# Patient Record
Sex: Female | Born: 1950 | Race: White | Hispanic: No | Smoking: Current every day smoker
Health system: Southern US, Community
[De-identification: ages and names within clinical notes are randomized; demographics above are authoritative.]

## PROBLEM LIST (undated history)

## (undated) DIAGNOSIS — E0590 Thyrotoxicosis, unspecified without thyrotoxic crisis or storm: Secondary | ICD-10-CM

## (undated) DIAGNOSIS — N816 Rectocele: Secondary | ICD-10-CM

## (undated) DIAGNOSIS — IMO0002 Reserved for concepts with insufficient information to code with codable children: Secondary | ICD-10-CM

## (undated) DIAGNOSIS — E785 Hyperlipidemia, unspecified: Secondary | ICD-10-CM

## (undated) DIAGNOSIS — M1990 Unspecified osteoarthritis, unspecified site: Secondary | ICD-10-CM

## (undated) DIAGNOSIS — K644 Residual hemorrhoidal skin tags: Secondary | ICD-10-CM

## (undated) DIAGNOSIS — K219 Gastro-esophageal reflux disease without esophagitis: Secondary | ICD-10-CM

## (undated) DIAGNOSIS — I10 Essential (primary) hypertension: Secondary | ICD-10-CM

## (undated) DIAGNOSIS — F419 Anxiety disorder, unspecified: Secondary | ICD-10-CM

## (undated) DIAGNOSIS — E119 Type 2 diabetes mellitus without complications: Secondary | ICD-10-CM

## (undated) HISTORY — DX: Residual hemorrhoidal skin tags: K64.4

## (undated) HISTORY — DX: Type 2 diabetes mellitus without complications: E11.9

## (undated) HISTORY — DX: Anxiety disorder, unspecified: F41.9

## (undated) HISTORY — DX: Thyrotoxicosis, unspecified without thyrotoxic crisis or storm: E05.90

## (undated) HISTORY — PX: BREAST BIOPSY: SHX20

## (undated) HISTORY — DX: Reserved for concepts with insufficient information to code with codable children: IMO0002

## (undated) HISTORY — DX: Gastro-esophageal reflux disease without esophagitis: K21.9

## (undated) HISTORY — DX: Hyperlipidemia, unspecified: E78.5

## (undated) HISTORY — DX: Rectocele: N81.6

## (undated) HISTORY — DX: Essential (primary) hypertension: I10

## (undated) HISTORY — PX: OTHER SURGICAL HISTORY: SHX169

---

## 2013-12-29 ENCOUNTER — Telehealth: Payer: Self-pay

## 2013-12-29 NOTE — Telephone Encounter (Signed)
PT was referred by Dr. Wende Neighbors for colonoscopy. Info said she had a previous bleed. Also abnormal labs ( LFT's and Anemia). The labs sent look good now. I have called and LMOM for a return call to get more info.

## 2013-12-30 NOTE — Telephone Encounter (Signed)
Pt called to be triaged. We clarified the abnormal labs was not checked on the referral. It was kind of confusing but her labs look great. She will call back tomorrow with list of meds. States she will be gone several weeks in Jan.

## 2014-02-05 NOTE — Telephone Encounter (Signed)
Letter mailed to pt to call when back from travels to schedule colonoscopy.

## 2014-08-09 ENCOUNTER — Other Ambulatory Visit (HOSPITAL_COMMUNITY): Payer: Self-pay | Admitting: Internal Medicine

## 2014-08-09 DIAGNOSIS — Z1231 Encounter for screening mammogram for malignant neoplasm of breast: Secondary | ICD-10-CM

## 2014-08-16 ENCOUNTER — Ambulatory Visit (HOSPITAL_COMMUNITY)
Admission: RE | Admit: 2014-08-16 | Discharge: 2014-08-16 | Disposition: A | Discharge status: Home / Self Care | Payer: 59 | Source: Ambulatory Visit | Attending: Internal Medicine | Admitting: Internal Medicine

## 2014-08-16 DIAGNOSIS — Z1231 Encounter for screening mammogram for malignant neoplasm of breast: Secondary | ICD-10-CM

## 2015-01-04 DIAGNOSIS — E039 Hypothyroidism, unspecified: Secondary | ICD-10-CM | POA: Diagnosis not present

## 2015-01-04 DIAGNOSIS — I10 Essential (primary) hypertension: Secondary | ICD-10-CM | POA: Diagnosis not present

## 2015-01-04 DIAGNOSIS — E782 Mixed hyperlipidemia: Secondary | ICD-10-CM | POA: Diagnosis not present

## 2015-01-04 DIAGNOSIS — E119 Type 2 diabetes mellitus without complications: Secondary | ICD-10-CM | POA: Diagnosis not present

## 2015-01-12 DIAGNOSIS — E782 Mixed hyperlipidemia: Secondary | ICD-10-CM | POA: Diagnosis not present

## 2015-01-12 DIAGNOSIS — I10 Essential (primary) hypertension: Secondary | ICD-10-CM | POA: Diagnosis not present

## 2015-01-12 DIAGNOSIS — R946 Abnormal results of thyroid function studies: Secondary | ICD-10-CM | POA: Diagnosis not present

## 2015-01-12 DIAGNOSIS — E119 Type 2 diabetes mellitus without complications: Secondary | ICD-10-CM | POA: Diagnosis not present

## 2015-02-07 DIAGNOSIS — J019 Acute sinusitis, unspecified: Secondary | ICD-10-CM | POA: Diagnosis not present

## 2015-04-11 DIAGNOSIS — E119 Type 2 diabetes mellitus without complications: Secondary | ICD-10-CM | POA: Diagnosis not present

## 2015-04-13 DIAGNOSIS — E119 Type 2 diabetes mellitus without complications: Secondary | ICD-10-CM | POA: Diagnosis not present

## 2015-04-13 DIAGNOSIS — E782 Mixed hyperlipidemia: Secondary | ICD-10-CM | POA: Diagnosis not present

## 2015-04-13 DIAGNOSIS — R946 Abnormal results of thyroid function studies: Secondary | ICD-10-CM | POA: Diagnosis not present

## 2015-04-13 DIAGNOSIS — I10 Essential (primary) hypertension: Secondary | ICD-10-CM | POA: Diagnosis not present

## 2015-04-26 ENCOUNTER — Encounter: Payer: Self-pay | Admitting: *Deleted

## 2015-05-05 DIAGNOSIS — L308 Other specified dermatitis: Secondary | ICD-10-CM | POA: Diagnosis not present

## 2015-05-05 DIAGNOSIS — D225 Melanocytic nevi of trunk: Secondary | ICD-10-CM | POA: Diagnosis not present

## 2015-05-05 DIAGNOSIS — L309 Dermatitis, unspecified: Secondary | ICD-10-CM | POA: Diagnosis not present

## 2015-05-05 DIAGNOSIS — L820 Inflamed seborrheic keratosis: Secondary | ICD-10-CM | POA: Diagnosis not present

## 2015-05-06 ENCOUNTER — Encounter: Payer: Self-pay | Admitting: Adult Health

## 2015-05-06 ENCOUNTER — Other Ambulatory Visit (HOSPITAL_COMMUNITY)
Admission: RE | Admit: 2015-05-06 | Discharge: 2015-05-06 | Disposition: A | Discharge status: Home / Self Care | Payer: PPO | Source: Ambulatory Visit | Attending: Adult Health | Admitting: Adult Health

## 2015-05-06 ENCOUNTER — Ambulatory Visit (INDEPENDENT_AMBULATORY_CARE_PROVIDER_SITE_OTHER): Payer: PPO | Admitting: Adult Health

## 2015-05-06 VITALS — BP 118/72 | HR 74 | Ht 66.25 in | Wt 204.0 lb

## 2015-05-06 DIAGNOSIS — Z1151 Encounter for screening for human papillomavirus (HPV): Secondary | ICD-10-CM | POA: Diagnosis not present

## 2015-05-06 DIAGNOSIS — Z1212 Encounter for screening for malignant neoplasm of rectum: Secondary | ICD-10-CM | POA: Diagnosis not present

## 2015-05-06 DIAGNOSIS — N816 Rectocele: Secondary | ICD-10-CM

## 2015-05-06 DIAGNOSIS — Z124 Encounter for screening for malignant neoplasm of cervix: Secondary | ICD-10-CM | POA: Diagnosis not present

## 2015-05-06 DIAGNOSIS — Z01419 Encounter for gynecological examination (general) (routine) without abnormal findings: Secondary | ICD-10-CM | POA: Diagnosis not present

## 2015-05-06 DIAGNOSIS — IMO0002 Reserved for concepts with insufficient information to code with codable children: Secondary | ICD-10-CM

## 2015-05-06 HISTORY — DX: Reserved for concepts with insufficient information to code with codable children: IMO0002

## 2015-05-06 HISTORY — DX: Rectocele: N81.6

## 2015-05-06 LAB — HEMOCCULT GUIAC POC 1CARD (OFFICE): FECAL OCCULT BLD: NEGATIVE

## 2015-05-06 MED ORDER — IBUPROFEN 800 MG PO TABS: 800.0000 mg | ORAL_TABLET | Freq: Three times a day (TID) | ORAL | Status: DC | PRN

## 2015-05-06 NOTE — Patient Instructions (Signed)
Physical in 1 year Pap in 3 if normal Mammogram yearly Colonoscopy advised Labs with PCP

## 2015-05-06 NOTE — Progress Notes (Signed)
Patient ID: Brandi Burke, female   DOB: 1950-05-01, 65 y.o.   MRN: [id number] History of Present Illness: Brandi Burke is a 65 year old white female in for a well woman gyn exam and pap. PCP is Dr Nevada Crane.   Current Medications, Allergies, Past Medical History, Past Surgical History, Family History and Social History were reviewed in [employer] record.     Review of Systems: Patient denies any headaches, hearing loss, fatigue, blurred vision, shortness of breath, chest pain, abdominal pain, problems with bowel movements, urination, or intercourse. No  mood swings.Has pain and aches in feet, hands and back at times. She saw Dr Allyn Kenner and had some AKs and moles froze and 1 biopsy on neck this week, has eye appt Monday and labs with Dr Nevada Crane next month.    Physical Exam:BP 118/72 mmHg  Pulse 74  Ht 5' 6.25" (1.683 m)  Wt 204 lb (92.534 kg)  BMI 32.67 kg/m2 General:  Well developed, well nourished, no acute distress Skin:  Warm and dry, has Aks and moles Neck:  Midline trachea, normal thyroid, good ROM, no lymphadenopathy, no carotid bruits heard Lungs; Clear to auscultation bilaterally Breast:  No dominant palpable mass, retraction, or nipple discharge Cardiovascular: Regular rate and rhythm Abdomen:  Soft, non tender, no hepatosplenomegaly,has navel ring Pelvic:  External genitalia is normal in appearance, no lesions.  The vagina has decreased color, moisture and rugae. Urethra has no lesions or masses. The cervix is smooth, pap with HPV performed, has mild cystocele.  Uterus is felt to be normal size, shape, and contour.  No adnexal masses or tenderness noted.Bladder is non tender, no masses felt. Rectal: Good sphincter tone, no polyps, or hemorrhoids felt.  Hemoccult negative.+rectocele Extremities/musculoskeletal:  No swelling or varicosities noted, no clubbing or cyanosis Psych:  No mood changes, alert and cooperative,seems happy   Impression: Well woman gyn exam  and pap Cystocele Rectocele     Plan: Physical in 1 year Pap in 3 if normal Labs with PCP Colonoscopy advised Mammogram yearly rx motrin 800 mg #60 take 1 every 8 hours prn with 1 refill

## 2015-05-11 LAB — CYTOLOGY - PAP

## 2015-05-25 ENCOUNTER — Telehealth: Payer: Self-pay | Admitting: Adult Health

## 2015-05-25 NOTE — Telephone Encounter (Signed)
Spoke with pt letting her know her pap was normal. Pt voiced understanding. JSY 

## 2015-07-25 DIAGNOSIS — E119 Type 2 diabetes mellitus without complications: Secondary | ICD-10-CM | POA: Diagnosis not present

## 2015-07-25 DIAGNOSIS — E039 Hypothyroidism, unspecified: Secondary | ICD-10-CM | POA: Diagnosis not present

## 2015-07-25 DIAGNOSIS — E782 Mixed hyperlipidemia: Secondary | ICD-10-CM | POA: Diagnosis not present

## 2015-07-27 ENCOUNTER — Other Ambulatory Visit (HOSPITAL_COMMUNITY): Payer: Self-pay | Admitting: Internal Medicine

## 2015-07-27 DIAGNOSIS — I10 Essential (primary) hypertension: Secondary | ICD-10-CM | POA: Diagnosis not present

## 2015-07-27 DIAGNOSIS — R946 Abnormal results of thyroid function studies: Secondary | ICD-10-CM | POA: Diagnosis not present

## 2015-07-27 DIAGNOSIS — Z1231 Encounter for screening mammogram for malignant neoplasm of breast: Secondary | ICD-10-CM

## 2015-07-27 DIAGNOSIS — R12 Heartburn: Secondary | ICD-10-CM | POA: Diagnosis not present

## 2015-07-27 DIAGNOSIS — E782 Mixed hyperlipidemia: Secondary | ICD-10-CM | POA: Diagnosis not present

## 2015-07-27 DIAGNOSIS — E119 Type 2 diabetes mellitus without complications: Secondary | ICD-10-CM | POA: Diagnosis not present

## 2015-07-27 DIAGNOSIS — F419 Anxiety disorder, unspecified: Secondary | ICD-10-CM | POA: Diagnosis not present

## 2015-07-28 DIAGNOSIS — E0590 Thyrotoxicosis, unspecified without thyrotoxic crisis or storm: Secondary | ICD-10-CM | POA: Diagnosis not present

## 2015-08-22 ENCOUNTER — Ambulatory Visit (HOSPITAL_COMMUNITY)
Admission: RE | Admit: 2015-08-22 | Discharge: 2015-08-22 | Disposition: A | Discharge status: Home / Self Care | Payer: PPO | Source: Ambulatory Visit | Attending: Internal Medicine | Admitting: Internal Medicine

## 2015-08-22 DIAGNOSIS — Z1231 Encounter for screening mammogram for malignant neoplasm of breast: Secondary | ICD-10-CM | POA: Diagnosis not present

## 2015-10-12 ENCOUNTER — Other Ambulatory Visit: Payer: Self-pay | Admitting: Adult Health

## 2015-11-02 DIAGNOSIS — E119 Type 2 diabetes mellitus without complications: Secondary | ICD-10-CM | POA: Diagnosis not present

## 2015-11-02 DIAGNOSIS — E782 Mixed hyperlipidemia: Secondary | ICD-10-CM | POA: Diagnosis not present

## 2015-11-04 DIAGNOSIS — R946 Abnormal results of thyroid function studies: Secondary | ICD-10-CM | POA: Diagnosis not present

## 2015-11-04 DIAGNOSIS — Z6831 Body mass index (BMI) 31.0-31.9, adult: Secondary | ICD-10-CM | POA: Diagnosis not present

## 2015-11-04 DIAGNOSIS — E782 Mixed hyperlipidemia: Secondary | ICD-10-CM | POA: Diagnosis not present

## 2015-11-04 DIAGNOSIS — I10 Essential (primary) hypertension: Secondary | ICD-10-CM | POA: Diagnosis not present

## 2015-11-04 DIAGNOSIS — E119 Type 2 diabetes mellitus without complications: Secondary | ICD-10-CM | POA: Diagnosis not present

## 2015-11-04 DIAGNOSIS — F419 Anxiety disorder, unspecified: Secondary | ICD-10-CM | POA: Diagnosis not present

## 2015-11-07 DIAGNOSIS — Z23 Encounter for immunization: Secondary | ICD-10-CM | POA: Diagnosis not present

## 2015-11-07 DIAGNOSIS — E0500 Thyrotoxicosis with diffuse goiter without thyrotoxic crisis or storm: Secondary | ICD-10-CM | POA: Diagnosis not present

## 2015-11-07 DIAGNOSIS — E119 Type 2 diabetes mellitus without complications: Secondary | ICD-10-CM | POA: Diagnosis not present

## 2015-12-09 DIAGNOSIS — M79642 Pain in left hand: Secondary | ICD-10-CM | POA: Diagnosis not present

## 2015-12-09 DIAGNOSIS — M79641 Pain in right hand: Secondary | ICD-10-CM | POA: Diagnosis not present

## 2015-12-14 DIAGNOSIS — G5601 Carpal tunnel syndrome, right upper limb: Secondary | ICD-10-CM | POA: Diagnosis not present

## 2015-12-14 DIAGNOSIS — G5602 Carpal tunnel syndrome, left upper limb: Secondary | ICD-10-CM | POA: Diagnosis not present

## 2015-12-23 DIAGNOSIS — M79642 Pain in left hand: Secondary | ICD-10-CM | POA: Diagnosis not present

## 2015-12-23 DIAGNOSIS — M79641 Pain in right hand: Secondary | ICD-10-CM | POA: Diagnosis not present

## 2015-12-29 ENCOUNTER — Other Ambulatory Visit: Payer: Self-pay | Admitting: Adult Health

## 2015-12-29 DIAGNOSIS — G5603 Carpal tunnel syndrome, bilateral upper limbs: Secondary | ICD-10-CM | POA: Diagnosis not present

## 2016-02-03 DIAGNOSIS — M79642 Pain in left hand: Secondary | ICD-10-CM | POA: Diagnosis not present

## 2016-02-03 DIAGNOSIS — M79641 Pain in right hand: Secondary | ICD-10-CM | POA: Diagnosis not present

## 2016-02-14 DIAGNOSIS — Z13820 Encounter for screening for osteoporosis: Secondary | ICD-10-CM | POA: Diagnosis not present

## 2016-02-14 DIAGNOSIS — I10 Essential (primary) hypertension: Secondary | ICD-10-CM | POA: Diagnosis not present

## 2016-02-14 DIAGNOSIS — E119 Type 2 diabetes mellitus without complications: Secondary | ICD-10-CM | POA: Diagnosis not present

## 2016-02-14 DIAGNOSIS — E0590 Thyrotoxicosis, unspecified without thyrotoxic crisis or storm: Secondary | ICD-10-CM | POA: Diagnosis not present

## 2016-02-23 DIAGNOSIS — G5600 Carpal tunnel syndrome, unspecified upper limb: Secondary | ICD-10-CM | POA: Diagnosis not present

## 2016-02-23 DIAGNOSIS — M1991 Primary osteoarthritis, unspecified site: Secondary | ICD-10-CM | POA: Diagnosis not present

## 2016-03-23 ENCOUNTER — Other Ambulatory Visit: Payer: Self-pay | Admitting: Adult Health

## 2016-05-02 DIAGNOSIS — I10 Essential (primary) hypertension: Secondary | ICD-10-CM | POA: Diagnosis not present

## 2016-05-02 DIAGNOSIS — E119 Type 2 diabetes mellitus without complications: Secondary | ICD-10-CM | POA: Diagnosis not present

## 2016-05-02 DIAGNOSIS — R946 Abnormal results of thyroid function studies: Secondary | ICD-10-CM | POA: Diagnosis not present

## 2016-05-04 DIAGNOSIS — R946 Abnormal results of thyroid function studies: Secondary | ICD-10-CM | POA: Diagnosis not present

## 2016-05-04 DIAGNOSIS — E782 Mixed hyperlipidemia: Secondary | ICD-10-CM | POA: Diagnosis not present

## 2016-05-04 DIAGNOSIS — E0590 Thyrotoxicosis, unspecified without thyrotoxic crisis or storm: Secondary | ICD-10-CM | POA: Diagnosis not present

## 2016-05-04 DIAGNOSIS — F419 Anxiety disorder, unspecified: Secondary | ICD-10-CM | POA: Diagnosis not present

## 2016-05-04 DIAGNOSIS — Z720 Tobacco use: Secondary | ICD-10-CM | POA: Diagnosis not present

## 2016-05-04 DIAGNOSIS — I10 Essential (primary) hypertension: Secondary | ICD-10-CM | POA: Diagnosis not present

## 2016-05-04 DIAGNOSIS — Z6831 Body mass index (BMI) 31.0-31.9, adult: Secondary | ICD-10-CM | POA: Diagnosis not present

## 2016-05-04 DIAGNOSIS — E119 Type 2 diabetes mellitus without complications: Secondary | ICD-10-CM | POA: Diagnosis not present

## 2016-05-04 DIAGNOSIS — F39 Unspecified mood [affective] disorder: Secondary | ICD-10-CM | POA: Diagnosis not present

## 2016-05-04 DIAGNOSIS — R12 Heartburn: Secondary | ICD-10-CM | POA: Diagnosis not present

## 2016-05-07 ENCOUNTER — Other Ambulatory Visit: Payer: PPO | Admitting: Adult Health

## 2016-05-16 ENCOUNTER — Encounter: Payer: Self-pay | Admitting: Adult Health

## 2016-05-16 ENCOUNTER — Ambulatory Visit (INDEPENDENT_AMBULATORY_CARE_PROVIDER_SITE_OTHER): Payer: PPO | Admitting: Adult Health

## 2016-05-16 VITALS — BP 160/90 | HR 74 | Ht 66.0 in | Wt 195.0 lb

## 2016-05-16 DIAGNOSIS — Z1211 Encounter for screening for malignant neoplasm of colon: Secondary | ICD-10-CM

## 2016-05-16 DIAGNOSIS — Z01419 Encounter for gynecological examination (general) (routine) without abnormal findings: Secondary | ICD-10-CM

## 2016-05-16 DIAGNOSIS — Z1212 Encounter for screening for malignant neoplasm of rectum: Secondary | ICD-10-CM | POA: Diagnosis not present

## 2016-05-16 DIAGNOSIS — N816 Rectocele: Secondary | ICD-10-CM

## 2016-05-16 LAB — HEMOCCULT GUIAC POC 1CARD (OFFICE): FECAL OCCULT BLD: NEGATIVE

## 2016-05-16 MED ORDER — IBUPROFEN 800 MG PO TABS: ORAL_TABLET | ORAL | 1 refills | Status: DC

## 2016-05-16 NOTE — Progress Notes (Addendum)
Patient ID: Brandi Burke, female   DOB: [birth year], 66 y.o.   MRN: [id number] History of Present Illness: Brandi Burke is a 66 year old white female in for well woman gyn exam,she had normal pap 05/06/15. PCP is Dr. Nevada Crane   Current Medications, Allergies, Past Medical History, Past Surgical History, Family History and Social History were reviewed in [facility] record.     Review of Systems: Patient denies any headaches, hearing loss, fatigue, blurred vision, shortness of breath, chest pain, abdominal pain, problems with bowel movements, urination, or intercourse. No joint pain or mood swings.    Physical Exam:BP (!) 160/90 (BP Location: Right Arm, Patient Position: Sitting, Cuff Size: Small)   Pulse 74   Ht 5\' 6"  (1.676 m)   Wt 195 lb (88.5 kg)   BMI 31.47 kg/m  General:  Well developed, well nourished, no acute distress Skin:  Warm and dry Neck:  Midline trachea, normal thyroid, good ROM, no lymphadenopathy.No carotid bruits  Lungs; Clear to auscultation bilaterally Breast:  No dominant palpable mass, retraction, or nipple discharge Cardiovascular: Regular rate and rhythm Abdomen:  Soft, non tender, no hepatosplenomegaly Pelvic:  External genitalia is normal in appearance, no lesions.  The vagina is normal in appearance. Urethra has no lesions or masses. The cervix is smooth.  Uterus is felt to be normal size, shape, and contour.  No adnexal masses or tenderness noted.Bladder is non tender, no masses felt. Rectal: Good sphincter tone, no polyps, or hemorrhoids felt.  Hemoccult negative.+rectocele Extremities/musculoskeletal:  No swelling or varicosities noted, no clubbing or cyanosis Psych:  No mood changes, alert and cooperative,seems happy   Impression: 1. Well woman exam with routine gynecological exam   2. Rectocele   3. Screening for colorectal cancer       Plan: Physical in 1 year Pap in 2020 Mammogram yearly Labs with PCP Colonoscopy per  GI Refilled ibuprofen 800 mg #60 with 1 refill

## 2016-06-01 DIAGNOSIS — G5603 Carpal tunnel syndrome, bilateral upper limbs: Secondary | ICD-10-CM | POA: Diagnosis not present

## 2016-06-01 DIAGNOSIS — F419 Anxiety disorder, unspecified: Secondary | ICD-10-CM | POA: Diagnosis not present

## 2016-06-01 DIAGNOSIS — I10 Essential (primary) hypertension: Secondary | ICD-10-CM | POA: Diagnosis not present

## 2016-06-01 DIAGNOSIS — F39 Unspecified mood [affective] disorder: Secondary | ICD-10-CM | POA: Diagnosis not present

## 2016-06-01 DIAGNOSIS — M25561 Pain in right knee: Secondary | ICD-10-CM | POA: Diagnosis not present

## 2016-06-05 ENCOUNTER — Telehealth: Payer: Self-pay

## 2016-06-05 NOTE — Telephone Encounter (Signed)
[phone]  Patient received letter to schedule tcs

## 2016-06-12 ENCOUNTER — Telehealth: Payer: Self-pay

## 2016-06-12 NOTE — Telephone Encounter (Signed)
LMOM for a return call.  

## 2016-06-12 NOTE — Telephone Encounter (Signed)
See separate triage.  

## 2016-06-13 ENCOUNTER — Other Ambulatory Visit: Payer: Self-pay

## 2016-06-13 DIAGNOSIS — Z1211 Encounter for screening for malignant neoplasm of colon: Secondary | ICD-10-CM

## 2016-06-13 NOTE — Telephone Encounter (Signed)
PT NEEDS TCS W/ MAC DUE TO POLYPHARMACY. NEED OPV PRIOR TO TCS.

## 2016-06-13 NOTE — Telephone Encounter (Signed)
Gastroenterology Pre-Procedure Review  Request Date: 06/12/2016 Requesting Physician: Dr. Wende Neighbors  PATIENT REVIEW QUESTIONS: The patient responded to the following health history questions as indicated:    1. Diabetes Melitis: YES 2. Joint replacements in the past 12 months: no 3. Major health problems in the past 3 months: no 4. Has an artificial valve or MVP: no 5. Has a defibrillator: no 6. Has been advised in past to take antibiotics in advance of a procedure like teeth cleaning: no 7. Family history of colon cancer: no  8. Alcohol Use: Rarely 9. History of sleep apnea: no  10. History of coronary artery or other vascular stents placed within the last 12 months: no    MEDICATIONS & ALLERGIES:    Patient reports the following regarding taking any blood thinners:   Plavix? no Aspirin? no Coumadin? no Brilinta? no Xarelto? no Eliquis? no Pradaxa? no Savaysa? no Effient? no  Patient confirms/reports the following medications:  Current Outpatient Prescriptions  Medication Sig Dispense Refill  . ALPRAZolam (XANAX) 1 MG tablet Take 1 mg by mouth at bedtime.    Marland Kitchen atorvastatin (LIPITOR) 20 MG tablet Take 10 mg by mouth daily.     . Calcium Carbonate-Vit D-Min (GNP CALCIUM 600 PLUS D/MINERAL PO) Take by mouth.    . DULoxetine (CYMBALTA) 20 MG capsule Take 40 mg by mouth daily.    Marland Kitchen ibuprofen (ADVIL,MOTRIN) 800 MG tablet TAKE 1 TABLET(800 MG) BY MOUTH EVERY 8 HOURS AS NEEDED 60 tablet 1  . methimazole (TAPAZOLE) 10 MG tablet 5 mg. Takes 1/2 of 5 mg daily    . Omega-3 Fatty Acids (FISH OIL) 1000 MG CAPS Take 500 mg by mouth.     Marland Kitchen omeprazole (PRILOSEC) 20 MG capsule Take 20 mg by mouth daily.    . sitaGLIPtin-metformin (JANUMET) 50-1000 MG tablet Take 1 tablet by mouth daily. Takes one tablet in the AM    . valsartan-hydrochlorothiazide (DIOVAN HCT) 160-25 MG tablet Take 1 tablet by mouth daily.     . methimazole (TAPAZOLE) 10 MG tablet      No current facility-administered  medications for this visit.     Patient confirms/reports the following allergies:  No Known Allergies  No orders of the defined types were placed in this encounter.   AUTHORIZATION INFORMATION Primary Insurance:   ID #:   Group #:  Pre-Cert / Auth required:  Pre-Cert / Auth #:   Secondary Insurance:   ID #:   Group #:  Pre-Cert / Auth required:  Pre-Cert / Auth #:   SCHEDULE INFORMATION: Procedure has been scheduled as follows:  Date: 07/25/2016         Time:  8:30 AM Location: [facility] Short Stay  This Gastroenterology Pre-Precedure Review Form is being routed to the following provider(s): Barney Drain, MD

## 2016-06-14 DIAGNOSIS — M1810 Unilateral primary osteoarthritis of first carpometacarpal joint, unspecified hand: Secondary | ICD-10-CM | POA: Diagnosis not present

## 2016-06-14 DIAGNOSIS — M25561 Pain in right knee: Secondary | ICD-10-CM | POA: Diagnosis not present

## 2016-06-14 NOTE — Telephone Encounter (Signed)
I called Hoyle Sauer and cancelled her tcs appt

## 2016-06-14 NOTE — Telephone Encounter (Signed)
Pt has an ov 08/08/16 at 3:00 pm and the patient is aware of her appt

## 2016-07-19 DIAGNOSIS — E0590 Thyrotoxicosis, unspecified without thyrotoxic crisis or storm: Secondary | ICD-10-CM | POA: Diagnosis not present

## 2016-07-19 DIAGNOSIS — E119 Type 2 diabetes mellitus without complications: Secondary | ICD-10-CM | POA: Diagnosis not present

## 2016-07-19 DIAGNOSIS — I10 Essential (primary) hypertension: Secondary | ICD-10-CM | POA: Diagnosis not present

## 2016-07-25 ENCOUNTER — Encounter (HOSPITAL_COMMUNITY): Payer: Self-pay

## 2016-07-25 ENCOUNTER — Ambulatory Visit (HOSPITAL_COMMUNITY): Admit: 2016-07-25 | Payer: PPO | Admitting: Gastroenterology

## 2016-07-25 SURGERY — COLONOSCOPY
Anesthesia: Moderate Sedation

## 2016-08-08 ENCOUNTER — Ambulatory Visit (INDEPENDENT_AMBULATORY_CARE_PROVIDER_SITE_OTHER): Payer: PPO | Admitting: Gastroenterology

## 2016-08-08 DIAGNOSIS — Z79899 Other long term (current) drug therapy: Secondary | ICD-10-CM

## 2016-08-08 DIAGNOSIS — Z1211 Encounter for screening for malignant neoplasm of colon: Secondary | ICD-10-CM | POA: Diagnosis not present

## 2016-08-09 ENCOUNTER — Other Ambulatory Visit: Payer: Self-pay

## 2016-08-09 ENCOUNTER — Encounter: Payer: Self-pay | Admitting: Gastroenterology

## 2016-08-09 DIAGNOSIS — Z79899 Other long term (current) drug therapy: Secondary | ICD-10-CM | POA: Insufficient documentation

## 2016-08-09 DIAGNOSIS — Z1211 Encounter for screening for malignant neoplasm of colon: Secondary | ICD-10-CM

## 2016-08-09 MED ORDER — PEG 3350-KCL-NA BICARB-NACL 420 G PO SOLR: 4000.0000 mL | ORAL | 0 refills | Status: DC

## 2016-08-09 NOTE — Patient Instructions (Signed)
1. Colonoscopy in the near future. Please see separate instructions. 

## 2016-08-09 NOTE — Progress Notes (Signed)
cc'ed to pcp °

## 2016-08-09 NOTE — Progress Notes (Signed)
Called pt. Colonoscopy w/Propofol with [facility] scheduled for 09/04/16 at 7:30am. She is aware of med adjustments for procedure. Rx for prep sent to pharmacy. Instructions mailed. Orders entered. I will call her back with pre-op appt.

## 2016-08-09 NOTE — Progress Notes (Signed)
Primary Care Physician:  Celene Squibb, MD  Primary Gastroenterologist:  Barney Drain, MD   Chief Complaint  Patient presents with  . Colonoscopy    HPI:  Brandi Burke is a 66 y.o. female here To schedule screening colonoscopy. Her last one was over 10 years ago was reportedly normal. No bowel concerns. Bowel movements are regular. No blood in the stool or melena. No abdominal pain. Denies upper GI symptoms. Dr. Oneida Alar recommended deep sedation in the OR.  Current Outpatient Prescriptions  Medication Sig Dispense Refill  . ALPRAZolam (XANAX) 1 MG tablet Take 1 mg by mouth at bedtime.    Marland Kitchen atorvastatin (LIPITOR) 20 MG tablet Take 10 mg by mouth daily.     . Calcium Carbonate-Vit D-Min (GNP CALCIUM 600 PLUS D/MINERAL PO) Take by mouth.    . DULoxetine (CYMBALTA) 20 MG capsule Take 40 mg by mouth daily.    Marland Kitchen ibuprofen (ADVIL,MOTRIN) 800 MG tablet TAKE 1 TABLET(800 MG) BY MOUTH EVERY 8 HOURS AS NEEDED 60 tablet 1  . JANUMET XR [phone] MG TB24 Take 1 tablet by mouth daily.  2  . losartan-hydrochlorothiazide (HYZAAR) 100-25 MG tablet Take 1 tablet by mouth daily.  0  . methimazole (TAPAZOLE) 10 MG tablet 5 mg. Takes 1/2 of 5 mg daily    . Omega-3 Fatty Acids (FISH OIL) 1000 MG CAPS Take 500 mg by mouth.     . methimazole (TAPAZOLE) 10 MG tablet      No current facility-administered medications for this visit.     Allergies as of 08/08/2016  . (No Known Allergies)    Past Medical History:  Diagnosis Date  . Anxiety   . Cystocele 05/06/2015  . Diabetes mellitus without complication (HCC)    Type 2  . GERD (gastroesophageal reflux disease)   . Hyperlipidemia   . Hypertension   . Rectocele 05/06/2015  . Thyrotoxicosis    without mention of goiter or other cause, and without mention of thyrotoxic crisis or storm    Past Surgical History:  Procedure Laterality Date  . bone spur     right small toe  . CESAREAN SECTION  [id number]  . moles      Family History  Problem Relation Age  of Onset  . Hypertension Mother   . Cancer Mother        breast  . Emphysema Father   . Other Sister        complications from MVA  . Diabetes Sister   . Cancer Sister        breast  . Cancer Sister        breast  . Cancer Sister        breast  . Diabetes Other        runs in dad's side of family  . Colon cancer Neg Hx     Social History   Social History  . Marital status: Legally Separated    Spouse name: N/A  . Number of children: N/A  . Years of education: N/A   Occupational History  . Not on file.   Social History Main Topics  . Smoking status: Current Every Day Smoker    Years: 40.00    Types: Cigarettes  . Smokeless tobacco: Never Used     Comment: quit smoking 01/02/11, started back 02/2016.  Marland Kitchen Alcohol use Yes     Comment: couple of beers on weekend  . Drug use: No  . Sexual activity: Yes    Birth control/  protection: Post-menopausal   Other Topics Concern  . Not on file   Social History Narrative  . No narrative on file      ROS:  General: Negative for anorexia, weight loss, fever, chills, fatigue, weakness. Eyes: Negative for vision changes.  ENT: Negative for hoarseness, difficulty swallowing , nasal congestion. CV: Negative for chest pain, angina, palpitations, dyspnea on exertion, peripheral edema.  Respiratory: Negative for dyspnea at rest, dyspnea on exertion, cough, sputum, wheezing.  GI: See history of present illness. GU:  Negative for dysuria, hematuria, urinary incontinence, urinary frequency, nocturnal urination.  MS: Negative for joint pain, low back pain.  Derm: Negative for rash or itching.  Neuro: Negative for weakness, abnormal sensation, seizure, frequent headaches, memory loss, confusion.  Psych: Negative for anxiety, depression, suicidal ideation, hallucinations.  Endo: Negative for unusual weight change.  Heme: Negative for bruising or bleeding. Allergy: Negative for rash or hives.    Physical Examination:  BP (!) 153/77    Pulse (!) 111   Temp 98.2 F (36.8 C) (Oral)   Ht 5\' 8"  (1.727 m)   Wt 191 lb (86.6 kg)   BMI 29.04 kg/m    General: Well-nourished, well-developed in no acute distress.  Head: Normocephalic, atraumatic.   Eyes: Conjunctiva pink, no icterus. Mouth: Oropharyngeal mucosa moist and pink , no lesions erythema or exudate. Neck: Supple without thyromegaly, masses, or lymphadenopathy.  Lungs: Clear to auscultation bilaterally.  Heart: Regular rate and rhythm, no murmurs rubs or gallops.  Abdomen: Bowel sounds are normal, nontender, nondistended, no hepatosplenomegaly or masses, no abdominal bruits or    hernia , no rebound or guarding.   Rectal: Deferred Extremities: No lower extremity edema. No clubbing or deformities.  Neuro: Alert and oriented x 4 , grossly normal neurologically.  Skin: Warm and dry, no rash or jaundice.   Psych: Alert and cooperative, normal mood and affect.  Labs: Heme negative 04/2016  Imaging Studies: No results found.

## 2016-08-09 NOTE — Progress Notes (Signed)
Please schedule TCS with [facility] in OR (polypharmacy).  Day before colonoscopy: Half dose Janumet Morning of colonoscopy: Hold Janumet, may take losartan-hydrochlorothiazide, methimazole.

## 2016-08-09 NOTE — Progress Notes (Signed)
Called and informed pt of pre-op appt 08/29/16 at 10:00am. Letter mailed with instructions.

## 2016-08-09 NOTE — Assessment & Plan Note (Signed)
Due for average risk screening colonoscopy. Given polypharmacy it is felt that she would require deep sedation in the OR.  I have discussed the risks, alternatives, benefits with regards to but not limited to the risk of reaction to medication, bleeding, infection, perforation and the patient is agreeable to proceed. Written consent to be obtained.

## 2016-08-10 NOTE — Patient Instructions (Signed)
PA info for TCS submitted via [employer]. Case suspended. Outpatient authorization# [id number].

## 2016-08-13 NOTE — Patient Instructions (Signed)
Received fax from [facility] PA# [id number] for TCS, 08/10/16-11/08/16

## 2016-08-24 NOTE — Patient Instructions (Signed)
Brandi Burke  08/24/2016     @PREFPERIOPPHARMACY @   Your procedure is scheduled on  09/04/2016.  Report to [facility] at  615  A.M.  Call this number if you have problems the morning of surgery:  [phone]   Remember:  Do not eat food or drink liquids after midnight.  Take these medicines the morning of surgery with A SIP OF WATER  Cymbalta, losartan, tapazole.   Do not wear jewelry, make-up or nail polish.  Do not wear lotions, powders, or perfumes, or deoderant.  Do not shave 48 hours prior to surgery.  Men may shave face and neck.  Do not bring valuables to the hospital.  [facility] is not responsible for any belongings or valuables.  Contacts, dentures or bridgework may not be worn into surgery.  Leave your suitcase in the car.  After surgery it may be brought to your room.  For patients admitted to the hospital, discharge time will be determined by your treatment team.  Patients discharged the day of surgery will not be allowed to drive home.   Name and phone number of your driver:   family Special instructions:  Follow the diet and prep instructions given to you by Dr Nona Dell office.  Please read over the following fact sheets that you were given. Anesthesia Post-op Instructions and Care and Recovery After Surgery       Colonoscopy, Adult A colonoscopy is an exam to look at the entire large intestine. During the exam, a lubricated, bendable tube is inserted into the anus and then passed into the rectum, colon, and other parts of the large intestine. A colonoscopy is often done as a part of normal colorectal screening or in response to certain symptoms, such as anemia, persistent diarrhea, abdominal pain, and blood in the stool. The exam can help screen for and diagnose medical problems, including:  Tumors.  Polyps.  Inflammation.  Areas of bleeding.  Tell a health care provider about:  Any allergies you have.  All medicines you are  taking, including vitamins, herbs, eye drops, creams, and over-the-counter medicines.  Any problems you or family members have had with anesthetic medicines.  Any blood disorders you have.  Any surgeries you have had.  Any medical conditions you have.  Any problems you have had passing stool. What are the risks? Generally, this is a safe procedure. However, problems may occur, including:  Bleeding.  A tear in the intestine.  A reaction to medicines given during the exam.  Infection (rare).  What happens before the procedure? Eating and drinking restrictions Follow instructions from your health care provider about eating and drinking, which may include:  A few days before the procedure - follow a low-fiber diet. Avoid nuts, seeds, dried fruit, raw fruits, and vegetables.  1-3 days before the procedure - follow a clear liquid diet. Drink only clear liquids, such as clear broth or bouillon, black coffee or tea, clear juice, clear soft drinks or sports drinks, gelatin dessert, and popsicles. Avoid any liquids that contain red or purple dye.  On the day of the procedure - do not eat or drink anything during the 2 hours before the procedure, or within the time period that your health care provider recommends.  Bowel prep If you were prescribed an oral bowel prep to clean out your colon:  Take it as told by your health care provider. Starting the day before your procedure, you will  need to drink a large amount of medicated liquid. The liquid will cause you to have multiple loose stools until your stool is almost clear or light green.  If your skin or anus gets irritated from diarrhea, you may use these to relieve the irritation: ? Medicated wipes, such as adult wet wipes with aloe and vitamin E. ? A skin soothing-product like petroleum jelly.  If you vomit while drinking the bowel prep, take a break for up to 60 minutes and then begin the bowel prep again. If vomiting continues and  you cannot take the bowel prep without vomiting, call your health care provider.  General instructions  Ask your health care provider about changing or stopping your regular medicines. This is especially important if you are taking diabetes medicines or blood thinners.  Plan to have someone take you home from the hospital or clinic. What happens during the procedure?  An IV tube may be inserted into one of your veins.  You will be given medicine to help you relax (sedative).  To reduce your risk of infection: ? Your health care team will wash or sanitize their hands. ? Your anal area will be washed with soap.  You will be asked to lie on your side with your knees bent.  Your health care provider will lubricate a long, thin, flexible tube. The tube will have a camera and a light on the end.  The tube will be inserted into your anus.  The tube will be gently eased through your rectum and colon.  Air will be delivered into your colon to keep it open. You may feel some pressure or cramping.  The camera will be used to take images during the procedure.  A small tissue sample may be removed from your body to be examined under a microscope (biopsy). If any potential problems are found, the tissue will be sent to a lab for testing.  If small polyps are found, your health care provider may remove them and have them checked for cancer cells.  The tube that was inserted into your anus will be slowly removed. The procedure may vary among health care providers and hospitals. What happens after the procedure?  Your blood pressure, heart rate, breathing rate, and blood oxygen level will be monitored until the medicines you were given have worn off.  Do not drive for 24 hours after the exam.  You may have a small amount of blood in your stool.  You may pass gas and have mild abdominal cramping or bloating due to the air that was used to inflate your colon during the exam.  It is up to  you to get the results of your procedure. Ask your health care provider, or the department performing the procedure, when your results will be ready. This information is not intended to replace advice given to you by your health care provider. Make sure you discuss any questions you have with your health care provider. Document Released: 12/16/1999 Document Revised: 10/19/2015 Document Reviewed: 03/01/2015 Elsevier Interactive Patient Education  2018 [employer].  Colonoscopy, Adult, Care After This sheet gives you information about how to care for yourself after your procedure. Your health care provider may also give you more specific instructions. If you have problems or questions, contact your health care provider. What can I expect after the procedure? After the procedure, it is common to have:  A small amount of blood in your stool for 24 hours after the procedure.  Some gas.  Mild abdominal cramping or bloating.  Follow these instructions at home: General instructions   For the first 24 hours after the procedure: ? Do not drive or use machinery. ? Do not sign important documents. ? Do not drink alcohol. ? Do your regular daily activities at a slower pace than normal. ? Eat soft, easy-to-digest foods. ? Rest often.  Take over-the-counter or prescription medicines only as told by your health care provider.  It is up to you to get the results of your procedure. Ask your health care provider, or the department performing the procedure, when your results will be ready. Relieving cramping and bloating  Try walking around when you have cramps or feel bloated.  Apply heat to your abdomen as told by your health care provider. Use a heat source that your health care provider recommends, such as a moist heat pack or a heating pad. ? Place a towel between your skin and the heat source. ? Leave the heat on for 20-30 minutes. ? Remove the heat if your skin turns bright red. This is  especially important if you are unable to feel pain, heat, or cold. You may have a greater risk of getting burned. Eating and drinking  Drink enough fluid to keep your urine clear or pale yellow.  Resume your normal diet as instructed by your health care provider. Avoid heavy or fried foods that are hard to digest.  Avoid drinking alcohol for as long as instructed by your health care provider. Contact a health care provider if:  You have blood in your stool 2-3 days after the procedure. Get help right away if:  You have more than a small spotting of blood in your stool.  You pass large blood clots in your stool.  Your abdomen is swollen.  You have nausea or vomiting.  You have a fever.  You have increasing abdominal pain that is not relieved with medicine. This information is not intended to replace advice given to you by your health care provider. Make sure you discuss any questions you have with your health care provider. Document Released: 08/02/2003 Document Revised: 09/12/2015 Document Reviewed: 03/01/2015 Elsevier Interactive Patient Education  2018 [place] Anesthesia is a term that refers to techniques, procedures, and medicines that help a person stay safe and comfortable during a medical procedure. Monitored anesthesia care, or sedation, is one type of anesthesia. Your anesthesia specialist may recommend sedation if you will be having a procedure that does not require you to be unconscious, such as:  Cataract surgery.  A dental procedure.  A biopsy.  A colonoscopy.  During the procedure, you may receive a medicine to help you relax (sedative). There are three levels of sedation:  Mild sedation. At this level, you may feel awake and relaxed. You will be able to follow directions.  Moderate sedation. At this level, you will be sleepy. You may not remember the procedure.  Deep sedation. At this level, you will be asleep. You will  not remember the procedure.  The more medicine you are given, the deeper your level of sedation will be. Depending on how you respond to the procedure, the anesthesia specialist may change your level of sedation or the type of anesthesia to fit your needs. An anesthesia specialist will monitor you closely during the procedure. Let your health care provider know about:  Any allergies you have.  All medicines you are taking, including vitamins, herbs, eye drops, creams, and over-the-counter medicines.  Any use of steroids (by mouth or as a cream).  Any problems you or family members have had with sedatives and anesthetic medicines.  Any blood disorders you have.  Any surgeries you have had.  Any medical conditions you have, such as sleep apnea.  Whether you are pregnant or may be pregnant.  Any use of cigarettes, alcohol, or street drugs. What are the risks? Generally, this is a safe procedure. However, problems may occur, including:  Getting too much medicine (oversedation).  Nausea.  Allergic reaction to medicines.  Trouble breathing. If this happens, a breathing tube may be used to help with breathing. It will be removed when you are awake and breathing on your own.  Heart trouble.  Lung trouble.  Before the procedure Staying hydrated Follow instructions from your health care provider about hydration, which may include:  Up to 2 hours before the procedure - you may continue to drink clear liquids, such as water, clear fruit juice, black coffee, and plain tea.  Eating and drinking restrictions Follow instructions from your health care provider about eating and drinking, which may include:  8 hours before the procedure - stop eating heavy meals or foods such as meat, fried foods, or fatty foods.  6 hours before the procedure - stop eating light meals or foods, such as toast or cereal.  6 hours before the procedure - stop drinking milk or drinks that contain milk.  2  hours before the procedure - stop drinking clear liquids.  Medicines Ask your health care provider about:  Changing or stopping your regular medicines. This is especially important if you are taking diabetes medicines or blood thinners.  Taking medicines such as aspirin and ibuprofen. These medicines can thin your blood. Do not take these medicines before your procedure if your health care provider instructs you not to.  Tests and exams  You will have a physical exam.  You may have blood tests done to show: ? How well your kidneys and liver are working. ? How well your blood can clot.  General instructions  Plan to have someone take you home from the hospital or clinic.  If you will be going home right after the procedure, plan to have someone with you for 24 hours.  What happens during the procedure?  Your blood pressure, heart rate, breathing, level of pain and overall condition will be monitored.  An IV tube will be inserted into one of your veins.  Your anesthesia specialist will give you medicines as needed to keep you comfortable during the procedure. This may mean changing the level of sedation.  The procedure will be performed. After the procedure  Your blood pressure, heart rate, breathing rate, and blood oxygen level will be monitored until the medicines you were given have worn off.  Do not drive for 24 hours if you received a sedative.  You may: ? Feel sleepy, clumsy, or nauseous. ? Feel forgetful about what happened after the procedure. ? Have a sore throat if you had a breathing tube during the procedure. ? Vomit. This information is not intended to replace advice given to you by your health care provider. Make sure you discuss any questions you have with your health care provider. Document Released: 09/13/2004 Document Revised: 05/27/2015 Document Reviewed: 04/10/2015 Elsevier Interactive Patient Education  2018 [place],  Care After These instructions provide you with information about caring for yourself after your procedure. Your health care provider may also give you more  specific instructions. Your treatment has been planned according to current medical practices, but problems sometimes occur. Call your health care provider if you have any problems or questions after your procedure. What can I expect after the procedure? After your procedure, it is common to:  Feel sleepy for several hours.  Feel clumsy and have poor balance for several hours.  Feel forgetful about what happened after the procedure.  Have poor judgment for several hours.  Feel nauseous or vomit.  Have a sore throat if you had a breathing tube during the procedure.  Follow these instructions at home: For at least 24 hours after the procedure:   Do not: ? Participate in activities in which you could fall or become injured. ? Drive. ? Use heavy machinery. ? Drink alcohol. ? Take sleeping pills or medicines that cause drowsiness. ? Make important decisions or sign legal documents. ? Take care of children on your own.  Rest. Eating and drinking  Follow the diet that is recommended by your health care provider.  If you vomit, drink water, juice, or soup when you can drink without vomiting.  Make sure you have little or no nausea before eating solid foods. General instructions  Have a responsible adult stay with you until you are awake and alert.  Take over-the-counter and prescription medicines only as told by your health care provider.  If you smoke, do not smoke without supervision.  Keep all follow-up visits as told by your health care provider. This is important. Contact a health care provider if:  You keep feeling nauseous or you keep vomiting.  You feel light-headed.  You develop a rash.  You have a fever. Get help right away if:  You have trouble breathing. This information is not intended to replace  advice given to you by your health care provider. Make sure you discuss any questions you have with your health care provider. Document Released: 04/10/2015 Document Revised: 08/10/2015 Document Reviewed: 04/10/2015 Elsevier Interactive Patient Education  [employer].

## 2016-08-29 ENCOUNTER — Encounter (HOSPITAL_COMMUNITY): Payer: Self-pay

## 2016-08-29 ENCOUNTER — Other Ambulatory Visit: Payer: Self-pay

## 2016-08-29 ENCOUNTER — Other Ambulatory Visit (HOSPITAL_COMMUNITY): Payer: Self-pay | Admitting: Internal Medicine

## 2016-08-29 ENCOUNTER — Encounter (HOSPITAL_COMMUNITY)
Admission: RE | Admit: 2016-08-29 | Discharge: 2016-08-29 | Disposition: A | Discharge status: Home / Self Care | Payer: PPO | Source: Ambulatory Visit | Attending: Gastroenterology | Admitting: Gastroenterology

## 2016-08-29 DIAGNOSIS — Z01812 Encounter for preprocedural laboratory examination: Secondary | ICD-10-CM | POA: Diagnosis not present

## 2016-08-29 DIAGNOSIS — F17210 Nicotine dependence, cigarettes, uncomplicated: Secondary | ICD-10-CM | POA: Diagnosis not present

## 2016-08-29 DIAGNOSIS — Z79899 Other long term (current) drug therapy: Secondary | ICD-10-CM | POA: Diagnosis not present

## 2016-08-29 DIAGNOSIS — I2583 Coronary atherosclerosis due to lipid rich plaque: Principal | ICD-10-CM

## 2016-08-29 DIAGNOSIS — Z1231 Encounter for screening mammogram for malignant neoplasm of breast: Secondary | ICD-10-CM | POA: Diagnosis not present

## 2016-08-29 DIAGNOSIS — Z1211 Encounter for screening for malignant neoplasm of colon: Secondary | ICD-10-CM | POA: Diagnosis not present

## 2016-08-29 DIAGNOSIS — I2510 Atherosclerotic heart disease of native coronary artery without angina pectoris: Secondary | ICD-10-CM

## 2016-08-29 DIAGNOSIS — Z01810 Encounter for preprocedural cardiovascular examination: Secondary | ICD-10-CM | POA: Diagnosis not present

## 2016-08-29 HISTORY — DX: Unspecified osteoarthritis, unspecified site: M19.90

## 2016-08-29 LAB — CBC WITH DIFFERENTIAL/PLATELET
BASOS ABS: 0 10*3/uL (ref 0.0–0.1)
Basophils Relative: 0 %
EOS PCT: 1 %
Eosinophils Absolute: 0.1 10*3/uL (ref 0.0–0.7)
HCT: 42.5 % (ref 36.0–46.0)
Hemoglobin: 14.6 g/dL (ref 12.0–15.0)
LYMPHS ABS: 2.6 10*3/uL (ref 0.7–4.0)
LYMPHS PCT: 28 %
MCH: 31.3 pg (ref 26.0–34.0)
MCHC: 34.4 g/dL (ref 30.0–36.0)
MCV: 91.2 fL (ref 78.0–100.0)
MONO ABS: 0.7 10*3/uL (ref 0.1–1.0)
Monocytes Relative: 7 %
Neutro Abs: 6.1 10*3/uL (ref 1.7–7.7)
Neutrophils Relative %: 64 %
PLATELETS: 288 10*3/uL (ref 150–400)
RBC: 4.66 MIL/uL (ref 3.87–5.11)
RDW: 13.7 % (ref 11.5–15.5)
WBC: 9.4 10*3/uL (ref 4.0–10.5)

## 2016-08-29 LAB — BASIC METABOLIC PANEL
Anion gap: 8 (ref 5–15)
BUN: 17 mg/dL (ref 6–20)
CALCIUM: 9.2 mg/dL (ref 8.9–10.3)
CO2: 26 mmol/L (ref 22–32)
CREATININE: 0.95 mg/dL (ref 0.44–1.00)
Chloride: 100 mmol/L — ABNORMAL LOW (ref 101–111)
GFR calc Af Amer: >60 mL/min (ref >60)
GLUCOSE: 134 mg/dL — AB (ref 65–99)
Potassium: 4.2 mmol/L (ref 3.5–5.1)
Sodium: 134 mmol/L — ABNORMAL LOW (ref 135–145)

## 2016-08-29 NOTE — Progress Notes (Signed)
Pt is aware and OK to refer to Cardiologist, either in [place] if possible or [place].

## 2016-08-29 NOTE — Progress Notes (Signed)
   08/29/16 1033  OBSTRUCTIVE SLEEP APNEA  Have you ever been diagnosed with sleep apnea through a sleep study? No  Do you snore loudly (loud enough to be heard through closed doors)?  1  Do you often feel tired, fatigued, or sleepy during the daytime (such as falling asleep during driving or talking to someone)? 1  Has anyone observed you stop breathing during your sleep? 0  Do you have, or are you being treated for high blood pressure? 1  BMI more than 35 kg/m2? 0  Age > 50 (1-yes) 1  Neck circumference greater than:Female 16 inches or larger, Female 17inches or larger? 0  Female Gender (Yes=1) 0  Obstructive Sleep Apnea Score 4  Score 5 or greater  Results sent to PCP

## 2016-08-31 ENCOUNTER — Ambulatory Visit (HOSPITAL_COMMUNITY)
Admission: RE | Admit: 2016-08-31 | Discharge: 2016-08-31 | Disposition: A | Discharge status: Home / Self Care | Payer: PPO | Source: Ambulatory Visit | Attending: Internal Medicine | Admitting: Internal Medicine

## 2016-08-31 DIAGNOSIS — Z01810 Encounter for preprocedural cardiovascular examination: Secondary | ICD-10-CM | POA: Insufficient documentation

## 2016-08-31 DIAGNOSIS — Z1211 Encounter for screening for malignant neoplasm of colon: Secondary | ICD-10-CM | POA: Insufficient documentation

## 2016-08-31 DIAGNOSIS — Z01812 Encounter for preprocedural laboratory examination: Secondary | ICD-10-CM | POA: Insufficient documentation

## 2016-08-31 DIAGNOSIS — F17210 Nicotine dependence, cigarettes, uncomplicated: Secondary | ICD-10-CM | POA: Insufficient documentation

## 2016-08-31 DIAGNOSIS — Z79899 Other long term (current) drug therapy: Secondary | ICD-10-CM | POA: Insufficient documentation

## 2016-08-31 DIAGNOSIS — Z1231 Encounter for screening mammogram for malignant neoplasm of breast: Secondary | ICD-10-CM | POA: Diagnosis not present

## 2016-09-04 ENCOUNTER — Ambulatory Visit (HOSPITAL_COMMUNITY): Payer: PPO | Admitting: Anesthesiology

## 2016-09-04 ENCOUNTER — Encounter (HOSPITAL_COMMUNITY)
Admission: RE | Disposition: A | Discharge status: Home / Self Care | Payer: Self-pay | Source: Ambulatory Visit | Attending: Gastroenterology

## 2016-09-04 ENCOUNTER — Ambulatory Visit (HOSPITAL_COMMUNITY)
Admission: RE | Admit: 2016-09-04 | Discharge: 2016-09-04 | Disposition: A | Discharge status: Home / Self Care | Payer: PPO | Source: Ambulatory Visit | Attending: Gastroenterology | Admitting: Gastroenterology

## 2016-09-04 ENCOUNTER — Encounter (HOSPITAL_COMMUNITY): Payer: Self-pay | Admitting: *Deleted

## 2016-09-04 DIAGNOSIS — Z1211 Encounter for screening for malignant neoplasm of colon: Secondary | ICD-10-CM | POA: Diagnosis not present

## 2016-09-04 DIAGNOSIS — F419 Anxiety disorder, unspecified: Secondary | ICD-10-CM | POA: Insufficient documentation

## 2016-09-04 DIAGNOSIS — K219 Gastro-esophageal reflux disease without esophagitis: Secondary | ICD-10-CM | POA: Diagnosis not present

## 2016-09-04 DIAGNOSIS — I10 Essential (primary) hypertension: Secondary | ICD-10-CM | POA: Insufficient documentation

## 2016-09-04 DIAGNOSIS — Z79899 Other long term (current) drug therapy: Secondary | ICD-10-CM | POA: Insufficient documentation

## 2016-09-04 DIAGNOSIS — E0590 Thyrotoxicosis, unspecified without thyrotoxic crisis or storm: Secondary | ICD-10-CM | POA: Insufficient documentation

## 2016-09-04 DIAGNOSIS — F17210 Nicotine dependence, cigarettes, uncomplicated: Secondary | ICD-10-CM | POA: Diagnosis not present

## 2016-09-04 DIAGNOSIS — E785 Hyperlipidemia, unspecified: Secondary | ICD-10-CM | POA: Diagnosis not present

## 2016-09-04 DIAGNOSIS — Q438 Other specified congenital malformations of intestine: Secondary | ICD-10-CM | POA: Insufficient documentation

## 2016-09-04 DIAGNOSIS — K644 Residual hemorrhoidal skin tags: Secondary | ICD-10-CM | POA: Insufficient documentation

## 2016-09-04 DIAGNOSIS — E119 Type 2 diabetes mellitus without complications: Secondary | ICD-10-CM | POA: Insufficient documentation

## 2016-09-04 HISTORY — PX: COLONOSCOPY WITH PROPOFOL: SHX5780

## 2016-09-04 LAB — GLUCOSE, CAPILLARY: GLUCOSE-CAPILLARY: 132 mg/dL — AB (ref 65–99)

## 2016-09-04 SURGERY — COLONOSCOPY WITH PROPOFOL
Anesthesia: Monitor Anesthesia Care

## 2016-09-04 MED ORDER — STERILE WATER FOR IRRIGATION IR SOLN
Status: DC | PRN
  Administered 2016-09-04: 100 mL

## 2016-09-04 MED ORDER — CHLORHEXIDINE GLUCONATE CLOTH 2 % EX PADS: 6.0000 | MEDICATED_PAD | Freq: Once | CUTANEOUS | Status: DC

## 2016-09-04 MED ORDER — MIDAZOLAM HCL 2 MG/2ML IJ SOLN
INTRAMUSCULAR | Status: AC
  Filled 2016-09-04: qty 2

## 2016-09-04 MED ORDER — MIDAZOLAM HCL 2 MG/2ML IJ SOLN
1.0000 mg | INTRAMUSCULAR | Status: AC
  Administered 2016-09-04: 2 mg via INTRAVENOUS

## 2016-09-04 MED ORDER — CHLORHEXIDINE GLUCONATE CLOTH 2 % EX PADS
6.0000 | MEDICATED_PAD | Freq: Once | CUTANEOUS | Status: DC
Start: 2016-09-04 — End: 2016-09-04

## 2016-09-04 MED ORDER — FENTANYL CITRATE (PF) 100 MCG/2ML IJ SOLN
25.0000 ug | Freq: Once | INTRAMUSCULAR | Status: AC
  Administered 2016-09-04: 25 ug via INTRAVENOUS

## 2016-09-04 MED ORDER — PROPOFOL 500 MG/50ML IV EMUL
INTRAVENOUS | Status: DC | PRN
  Administered 2016-09-04: 150 ug/kg/min via INTRAVENOUS
  Administered 2016-09-04: 08:00:00 via INTRAVENOUS

## 2016-09-04 MED ORDER — FENTANYL CITRATE (PF) 100 MCG/2ML IJ SOLN
INTRAMUSCULAR | Status: AC
  Filled 2016-09-04: qty 2

## 2016-09-04 MED ORDER — LACTATED RINGERS IV SOLN
INTRAVENOUS | Status: DC
  Administered 2016-09-04: 07:00:00 via INTRAVENOUS

## 2016-09-04 NOTE — Anesthesia Preprocedure Evaluation (Signed)
Anesthesia Evaluation  Patient identified by MRN, date of birth, ID band Patient awake    Reviewed: Allergy & Precautions, NPO status , Patient's Chart, lab work & pertinent test results  Airway Mallampati: I  TM Distance: >3 FB     Dental  (+) Teeth Intact   Pulmonary Current Smoker,    breath sounds clear to auscultation       Cardiovascular hypertension, Pt. on medications  Rhythm:Regular Rate:Normal     Neuro/Psych PSYCHIATRIC DISORDERS Anxiety    GI/Hepatic GERD  ,  Endo/Other  diabetes, Type 2Hyperthyroidism   Renal/GU      Musculoskeletal  (+) Arthritis ,   Abdominal   Peds  Hematology   Anesthesia Other Findings   Reproductive/Obstetrics                             Anesthesia Physical Anesthesia Plan  ASA: III  Anesthesia Plan: MAC   Post-op Pain Management:    Induction: Intravenous  PONV Risk Score and Plan:   Airway Management Planned: Simple Face Mask  Additional Equipment:   Intra-op Plan:   Post-operative Plan:   Informed Consent: I have reviewed the patients History and Physical, chart, labs and discussed the procedure including the risks, benefits and alternatives for the proposed anesthesia with the patient or authorized representative who has indicated his/her understanding and acceptance.     Plan Discussed with:   Anesthesia Plan Comments:         Anesthesia Quick Evaluation

## 2016-09-04 NOTE — H&P (Signed)
Primary Care Physician:  Celene Squibb, MD Primary Gastroenterologist:  Dr. Oneida Alar  Pre-Procedure History & Physical: HPI:  Brandi Burke is a 66 y.o. female here for [place].  Past Medical History:  Diagnosis Date  . Anxiety   . Arthritis   . Cystocele 05/06/2015  . Diabetes mellitus without complication (HCC)    Type 2  . GERD (gastroesophageal reflux disease)   . Hyperlipidemia   . Hypertension   . Rectocele 05/06/2015  . Thyrotoxicosis    without mention of goiter or other cause, and without mention of thyrotoxic crisis or storm    Past Surgical History:  Procedure Laterality Date  . bone spur     right small toe  . BREAST SURGERY Left    biopsy  . CESAREAN SECTION  [id number]  . moles      Prior to Admission medications   Medication Sig Start Date End Date Taking? Authorizing Provider  ALPRAZolam Duanne Moron) 1 MG tablet Take 1 mg by mouth at bedtime.   Yes [provider]  APPLE CIDER VINEGAR PO Take 1 capsule by mouth 2 (two) times daily.   Yes [provider]  atorvastatin (LIPITOR) 20 MG tablet Take 10 mg by mouth daily.    Yes [provider]  Calcium Carbonate-Vit D-Min (GNP CALCIUM 600 PLUS D/MINERAL PO) Take by mouth.   Yes [provider]  DULoxetine HCl 40 MG CPEP Take 1 capsule by mouth daily. 08/05/16  Yes [provider]  JANUMET XR [phone] MG TB24 Take 1 tablet by mouth daily. 07/06/16  Yes [provider]  losartan-hydrochlorothiazide (HYZAAR) 100-25 MG tablet Take 1 tablet by mouth daily. 07/19/16  Yes [provider]  meloxicam (MOBIC) 15 MG tablet Take 1 tablet by mouth daily. 08/17/16  Yes [provider]  methimazole (TAPAZOLE) 5 MG tablet Take 0.5 tablets by mouth as directed. .5 tablet for 5 days, skip Saturday and Sunday 08/17/16  Yes [provider]  Omega-3 Fatty Acids (FISH OIL) 1000 MG CAPS Take 500 mg by mouth.    Yes [provider]    Allergies as of  08/09/2016  . (No Known Allergies)    Family History  Problem Relation Age of Onset  . Hypertension Mother   . Cancer Mother        breast  . Emphysema Father   . Other Sister        complications from MVA  . Diabetes Sister   . Cancer Sister        breast  . Cancer Sister        breast  . Cancer Sister        breast  . Diabetes Other        runs in dad's side of family  . Colon cancer Neg Hx     Social History   Social History  . Marital status: Legally Separated    Spouse name: N/A  . Number of children: N/A  . Years of education: N/A   Occupational History  . Not on file.   Social History Main Topics  . Smoking status: Current Every Day Smoker    Packs/day: 1.00    Years: 40.00    Types: Cigarettes  . Smokeless tobacco: Never Used     Comment: quit smoking 01/02/11, started back 02/2016.  Marland Kitchen Alcohol use Yes     Comment: couple of beers on weekend  . Drug use: No  . Sexual activity: Yes  Birth control/ protection: Post-menopausal   Other Topics Concern  . Not on file   Social History Narrative  . No narrative on file    Review of Systems: See HPI, otherwise negative ROS   Physical Exam: BP 130/68   Pulse 93   Temp 98 F (36.7 C) (Oral)   Resp 18   SpO2 97%  General:   Alert,  pleasant and cooperative in NAD Head:  Normocephalic and atraumatic. Neck:  Supple; Lungs:  Clear throughout to auscultation.    Heart:  Regular rate and rhythm. Abdomen:  Soft, nontender and nondistended. Normal bowel sounds, without guarding, and without rebound.   Neurologic:  Alert and  oriented x4;  grossly normal neurologically.  Impression/Plan:    SCREENING  Plan:  1. TCS TODAY. DISCUSSED PROCEDURE, BENEFITS, & RISKS: < 1% chance of medication reaction, bleeding, perforation, or rupture of spleen/liver.

## 2016-09-04 NOTE — Transfer of Care (Signed)
Immediate Anesthesia Transfer of Care Note  Patient: Brandi Burke  Procedure(s) Performed: Procedure(s) with comments: COLONOSCOPY WITH PROPOFOL (N/A) - 7:30am  Patient Location: PACU  Anesthesia Type:MAC  Level of Consciousness: awake, alert , oriented and patient cooperative  Airway & Oxygen Therapy: Patient Spontanous Breathing  Post-op Assessment: Report given to RN and Post -op Vital signs reviewed and stable  Post vital signs: Reviewed and stable  Last Vitals:  Vitals:   09/04/16 0718 09/04/16 0720  BP:  115/66  Pulse:    Resp: 20 (!) 27  Temp:    SpO2: 97% 99%    Last Pain:  Vitals:   09/04/16 0700  TempSrc: Oral      Patients Stated Pain Goal: 4 (20/72/18 2883)  Complications: No apparent anesthesia complications

## 2016-09-04 NOTE — Anesthesia Procedure Notes (Signed)
Procedure Name: MAC Date/Time: 09/04/2016 7:30 AM Performed by: Andree Elk, AMY A Pre-anesthesia Checklist: Patient identified, Emergency Drugs available, Suction available, Patient being monitored and Timeout performed Oxygen Delivery Method: Simple face mask

## 2016-09-04 NOTE — Anesthesia Postprocedure Evaluation (Signed)
Anesthesia Post Note  Patient: Brandi Burke  Procedure(s) Performed: Procedure(s) (LRB): COLONOSCOPY WITH PROPOFOL (N/A)  Patient location during evaluation: PACU Anesthesia Type: MAC Level of consciousness: awake and alert, oriented and patient cooperative Pain management: pain level controlled Vital Signs Assessment: post-procedure vital signs reviewed and stable Respiratory status: spontaneous breathing Cardiovascular status: stable Postop Assessment: no signs of nausea or vomiting Anesthetic complications: no     Last Vitals:  Vitals:   09/04/16 0718 09/04/16 0720  BP:  115/66  Pulse:    Resp: 20 (!) 27  Temp:    SpO2: 97% 99%    Last Pain:  Vitals:   09/04/16 0700  TempSrc: Oral                 ADAMS, AMY A

## 2016-09-04 NOTE — Op Note (Signed)
[facility] Patient Name: Brandi Burke Procedure Date: 09/04/2016 7:03 AM MRN: [id number] Date of Birth: [birth year] Attending MD: Barney Drain MD, MD CSN: [id number] Age: 66 Admit Type: Outpatient Procedure:                Colonoscopy, screening Indications:              Screening for colorectal malignant neoplasm Providers:                Barney Drain MD, MD, Rosina Lowenstein, RN, Aram Candela Referring MD:             Edwinna Areola. Hall MD Medicines:                Propofol per Anesthesia Complications:            No immediate complications. Estimated Blood Loss:     Estimated blood loss: none. Procedure:                Pre-Anesthesia Assessment:                           - Prior to the procedure, a History and Physical                            was performed, and patient medications and                            allergies were reviewed. The patient's tolerance of                            previous anesthesia was also reviewed. The risks                            and benefits of the procedure and the sedation                            options and risks were discussed with the patient.                            All questions were answered, and informed consent                            was obtained. Prior Anticoagulants: The patient has                            taken no previous anticoagulant or antiplatelet                            agents. ASA Grade Assessment: II - A patient with                            mild systemic disease. After reviewing the risks                            and benefits, the patient was deemed in  satisfactory condition to undergo the procedure.                            After obtaining informed consent, the colonoscope                            was passed under direct vision. Throughout the                            procedure, the patient's blood pressure, pulse, and                            oxygen saturations were  monitored continuously. The                            EC-[id number] ([id number]) scope was introduced through                            the anus and advanced to the the cecum, identified                            by appendiceal orifice and ileocecal valve. The                            colonoscopy was somewhat difficult due to a                            tortuous colon. Successful completion of the                            procedure was aided by increasing the dose of                            sedation medication and COLOWRAP. The ileocecal                            valve, appendiceal orifice, and rectum were                            photographed. The quality of the bowel preparation                            was good. The patient tolerated the procedure                            fairly well. Scope In: 7:44:01 AM Scope Out: 8:03:29 AM Scope Withdrawal Time: 0 hours 15 minutes 24 seconds  Total Procedure Duration: 0 hours 19 minutes 28 seconds  Findings:      The recto-sigmoid colon and sigmoid colon were moderately redundant.      External hemorrhoids were found during retroflexion. The hemorrhoids       were moderate. Impression:               - Redundant left colon.                           -  External hemorrhoids. Moderate Sedation:      Per Anesthesia Care Recommendation:           - Repeat colonoscopy in 10 years for surveillance.                           - High fiber diet.                           - Continue present medications.                           - Patient has a contact number available for                            emergencies. The signs and symptoms of potential                            delayed complications were discussed with the                            patient. Return to normal activities tomorrow.                            Written discharge instructions were provided to the                            patient. Procedure Code(s):        ---  Professional ---                           [phone], Colonoscopy, flexible; diagnostic, including                            collection of specimen(s) by brushing or washing,                            when performed (separate procedure) Diagnosis Code(s):        --- Professional ---                           Z12.11, Encounter for screening for malignant                            neoplasm of colon                           K64.4, Residual hemorrhoidal skin tags                           Q43.8, Other specified congenital malformations of                            intestine CPT copyright 2016 American Medical Association. All rights reserved. The codes documented in this report are preliminary and upon coder review may  be revised to meet current compliance requirements. Barney Drain, MD Barney Drain MD, MD 09/04/2016 8:13:16  AM This report has been signed electronically. Number of Addenda: 0

## 2016-09-04 NOTE — Discharge Instructions (Signed)
You have moderate size EXTERNAL hemorrhoids. YOU DID NOT HAVE ANY POLYPS.   CONTINUE YOUR WEIGHT LOSS EFFORTS. LOSE TEN POUNDS.  DRINK WATER TO KEEP YOUR URINE LIGHT YELLOW.  FOLLOW A HIGH FIBER DIET. AVOID ITEMS THAT CAUSE BLOATING. SEE INFO BELOW.  USE PREPARATION H FOUR TIMES  A DAY IF NEEDED TO RELIEVE RECTAL PAIN/PRESSURE/BLEEDING.  RETURN TO WORK ON SEP 6.  Next colonoscopy in 10 years.  Colonoscopy Care After Read the instructions outlined below and refer to this sheet in the next week. These discharge instructions provide you with general information on caring for yourself after you leave the hospital. While your treatment has been planned according to the most current medical practices available, unavoidable complications occasionally occur. If you have any problems or questions after discharge, call DR. Cable Fearn, [phone].  ACTIVITY  You may resume your regular activity, but move at a slower pace for the next 24 hours.   Take frequent rest periods for the next 24 hours.   Walking will help get rid of the air and reduce the bloated feeling in your belly (abdomen).   No driving for 24 hours (because of the medicine (anesthesia) used during the test).   You may shower.   Do not sign any important legal documents or operate any machinery for 24 hours (because of the anesthesia used during the test).    NUTRITION  Drink plenty of fluids.   You may resume your normal diet as instructed by your doctor.   Begin with a light meal and progress to your normal diet. Heavy or fried foods are harder to digest and may make you feel sick to your stomach (nauseated).   Avoid alcoholic beverages for 24 hours or as instructed.    MEDICATIONS  You may resume your normal medications.   WHAT YOU CAN EXPECT TODAY  Some feelings of bloating in the abdomen.   Passage of more gas than usual.   Spotting of blood in your stool or on the toilet paper  .  IF YOU HAD POLYPS  REMOVED DURING THE COLONOSCOPY:  Eat a soft diet IF YOU HAVE NAUSEA, BLOATING, ABDOMINAL PAIN, OR VOMITING.    FINDING OUT THE RESULTS OF YOUR TEST Not all test results are available during your visit. DR. Oneida Alar WILL CALL YOU WITHIN 14 DAYS OF YOUR PROCEDUE WITH YOUR RESULTS. Do not assume everything is normal if you have not heard from DR. Azjah Pardo, CALL HER OFFICE AT [phone].  SEEK IMMEDIATE MEDICAL ATTENTION AND CALL THE OFFICE: [phone] IF:  You have more than a spotting of blood in your stool.   Your belly is swollen (abdominal distention).   You are nauseated or vomiting.   You have a temperature over 101F.   You have abdominal pain or discomfort that is severe or gets worse throughout the day.  High-Fiber Diet A high-fiber diet changes your normal diet to include more whole grains, legumes, fruits, and vegetables. Changes in the diet involve replacing refined carbohydrates with unrefined foods. The calorie level of the diet is essentially unchanged. The Dietary Reference Intake (recommended amount) for adult males is 38 grams per day. For adult females, it is 25 grams per day. Pregnant and lactating women should consume 28 grams of fiber per day. Fiber is the intact part of a plant that is not broken down during digestion. Functional fiber is fiber that has been isolated from the plant to provide a beneficial effect in the body. PURPOSE  Increase stool bulk.  Ease and regulate bowel movements.   Lower cholesterol.   REDUCE RISK OF COLON CANCER  INDICATIONS THAT YOU NEED MORE FIBER  Constipation and hemorrhoids.   Uncomplicated diverticulosis (intestine condition) and irritable bowel syndrome.   Weight management.   As a protective measure against hardening of the arteries (atherosclerosis), diabetes, and cancer.   GUIDELINES FOR INCREASING FIBER IN THE DIET  Start adding fiber to the diet slowly. A gradual increase of about 5 more grams (2 slices of  whole-wheat bread, 2 servings of most fruits or vegetables, or 1 bowl of high-fiber cereal) per day is best. Too rapid an increase in fiber may result in constipation, flatulence, and bloating.   Drink enough water and fluids to keep your urine clear or pale yellow. Water, juice, or caffeine-free drinks are recommended. Not drinking enough fluid may cause constipation.   Eat a variety of high-fiber foods rather than one type of fiber.   Try to increase your intake of fiber through using high-fiber foods rather than fiber pills or supplements that contain small amounts of fiber.   The goal is to change the types of food eaten. Do not supplement your present diet with high-fiber foods, but replace foods in your present diet.    INCLUDE A VARIETY OF FIBER SOURCES  Replace refined and processed grains with whole grains, canned fruits with fresh fruits, and incorporate other fiber sources. White rice, white breads, and most bakery goods contain little or no fiber.   Brown whole-grain rice, buckwheat oats, and many fruits and vegetables are all good sources of fiber. These include: broccoli, Brussels sprouts, cabbage, cauliflower, beets, sweet potatoes, white potatoes (skin on), carrots, tomatoes, eggplant, squash, berries, fresh fruits, and dried fruits.   Cereals appear to be the richest source of fiber. Cereal fiber is found in whole grains and bran. Bran is the fiber-rich outer coat of cereal grain, which is largely removed in refining. In whole-grain cereals, the bran remains. In breakfast cereals, the largest amount of fiber is found in those with "bran" in their names. The fiber content is sometimes indicated on the label.   You may need to include additional fruits and vegetables each day.   In baking, for 1 cup white flour, you may use the following substitutions:   1 cup whole-wheat flour minus 2 tablespoons.   1/2 cup white flour plus 1/2 cup whole-wheat flour.    Hemorrhoids Hemorrhoids are dilated (enlarged) veins around the rectum. Sometimes clots will form in the veins. This makes them swollen and painful. These are called thrombosed hemorrhoids. Causes of hemorrhoids include:  Constipation.   Straining to have a bowel movement.   HEAVY LIFTING  HOME CARE INSTRUCTIONS  Eat a well balanced diet and drink 6 to 8 glasses of water every day to avoid constipation. You may also use a bulk laxative.   Avoid straining to have bowel movements.   Keep anal area dry and clean.   Do not use a donut shaped pillow or sit on the toilet for long periods. This increases blood pooling and pain.   Move your bowels when your body has the urge; this will require less straining and will decrease pain and pressure.

## 2016-09-07 ENCOUNTER — Encounter (HOSPITAL_COMMUNITY): Payer: Self-pay | Admitting: Gastroenterology

## 2016-09-20 DIAGNOSIS — J019 Acute sinusitis, unspecified: Secondary | ICD-10-CM | POA: Diagnosis not present

## 2016-09-20 DIAGNOSIS — Z6830 Body mass index (BMI) 30.0-30.9, adult: Secondary | ICD-10-CM | POA: Diagnosis not present

## 2016-09-27 ENCOUNTER — Encounter: Payer: Self-pay | Admitting: Cardiology

## 2016-09-27 NOTE — Progress Notes (Signed)
Cardiology Office Note  Date: 09/28/2016   ID: Brandi Burke, DOB 1950/01/26, MRN [id number]  PCP: Celene Squibb, MD  Consulting Cardiologist: Rozann Lesches, MD   Chief Complaint  Patient presents with  . Cardiac evaluation    History of Present Illness: Brandi Burke is a 66 y.o. female referred for cardiology consultation by Dr. Oneida Alar due to abnormal ECG. Limited information was provided. I reviewed the chart. She presents today for further evaluation. Her PCP is Dr. Nevada Crane, she states that she follows regularly and has had no major change in her health recently. She works part-time at [employer]. She has to get up very early in the morning and is usually too tired to do any regular exercise. She has a long-standing history of tobacco abuse and has mild chronic dyspnea on exertion. She does not report any angina symptoms, has had no palpitations, lightheadedness, or syncope.  I personally reviewed the tracing from 08/29/2016 which shows normal sinus rhythm with low voltage and decreased R wave progression, not definitive septal infarct. There is no old tracing for comparison. She states that she has been told in the past that her ECG was abnormal.  I reviewed her medications which are outlined below. She reports compliance. States his blood pressure has been well controlled on Hyzaar and that her lipids are managed with atorvastatin.  She has no previously documented history of ischemic heart disease or myocardial infarction.  Past Medical History:  Diagnosis Date  . Anxiety   . Arthritis   . Cystocele 05/06/2015  . Essential hypertension   . External hemorrhoids   . GERD (gastroesophageal reflux disease)   . Hyperlipidemia   . Rectocele 05/06/2015  . Thyrotoxicosis   . Type 2 diabetes mellitus ([place])     Past Surgical History:  Procedure Laterality Date  . Bone spur     Right small toe  . BREAST BIOPSY Left   . CESAREAN SECTION  [id number]  . COLONOSCOPY WITH  PROPOFOL N/A 09/04/2016   Procedure: COLONOSCOPY WITH PROPOFOL;  Surgeon: Danie Binder, MD;  Location: [facility];  Service: Endoscopy;  Laterality: N/A;  7:30am  . Mole resection      Current Outpatient Prescriptions  Medication Sig Dispense Refill  . ALPRAZolam (XANAX) 1 MG tablet Take 1 mg by mouth at bedtime.    . APPLE CIDER VINEGAR PO Take 1 capsule by mouth 2 (two) times daily.    Marland Kitchen atorvastatin (LIPITOR) 20 MG tablet Take 10 mg by mouth daily.     . Calcium Carbonate-Vit D-Min (GNP CALCIUM 600 PLUS D/MINERAL PO) Take by mouth.    . DULoxetine HCl 40 MG CPEP Take 1 capsule by mouth daily.    Marland Kitchen JANUMET XR [phone] MG TB24 Take 1 tablet by mouth daily.  2  . losartan-hydrochlorothiazide (HYZAAR) 100-25 MG tablet Take 1 tablet by mouth daily.  0  . meloxicam (MOBIC) 15 MG tablet Take 1 tablet by mouth daily.  2  . methimazole (TAPAZOLE) 5 MG tablet Take 0.5 tablets by mouth as directed. .5 tablet for 5 days, skip Saturday and Sunday  0  . Omega-3 Fatty Acids (FISH OIL) 1000 MG CAPS Take 500 mg by mouth.      No current facility-administered medications for this visit.    Allergies:  Patient has no known allergies.   Social History: The patient  reports that she has been smoking Cigarettes.  She has a 40.00 pack-year smoking history. She has never used  smokeless tobacco. She reports that she drinks alcohol. She reports that she does not use drugs.   Family History: The patient's family history includes Breast cancer in her mother, sister, sister, and sister; Diabetes in her other and sister; Emphysema in her father; Hypertension in her mother; Other in her sister.   ROS:  Please see the history of present illness. Otherwise, complete review of systems is positive for fatigue.  All other systems are reviewed and negative.   Physical Exam: VS:  BP 138/80   Pulse 85   Ht 5\' 6"  (1.676 m)   Wt 193 lb 12.8 oz (87.9 kg)   SpO2 97%   BMI 31.28 kg/m , BMI Body mass index is 31.28  kg/m.  Wt Readings from Last 3 Encounters:  09/28/16 193 lb 12.8 oz (87.9 kg)  08/29/16 193 lb (87.5 kg)  08/09/16 191 lb (86.6 kg)    General: Obese woman, appears comfortable at rest. HEENT: Conjunctiva and lids normal, oropharynx clear. Neck: Supple, no elevated JVP or carotid bruits, no thyromegaly. Lungs: Decreased breath sounds without wheezing, nonlabored breathing at rest. Cardiac: Regular rate and rhythm, no S3 or significant systolic murmur, no pericardial rub. Abdomen: Soft, nontender, bowel sounds present, no guarding or rebound. Extremities: No pitting edema, distal pulses 2+. Skin: Warm and dry. Musculoskeletal: No kyphosis. Neuropsychiatric: Alert and oriented x3, affect grossly appropriate.  ECG: No old tracings available for review.  Recent Labwork: 08/29/2016: BUN 17; Creatinine, Ser 0.95; Hemoglobin 14.6; Platelets 288; Potassium 4.2; Sodium 134  Assessment and Plan:  1. Abnormal ECG with decreased anteroseptal R-wave progression, doubt that this represents a true infarct pattern however. She does not report any exertional angina and has chronic mild dyspnea on exertion with long-standing history of tobacco abuse. She does have cardiac risk factors and is following on medical therapy for risk factor modification with Dr. Nevada Crane. Today we discussed obtaining an echocardiogram to assess cardiac structure and function. If LVEF is normal and she has no distinct wall motion abnormalities to suggest previous infarct, I would recommend risk factor modification and observation. Certainly if she starts to develop angina symptoms, further ischemic testing would be warranted.  2. Essential hypertension, on Hyzaar. Blood pressure control is reasonable today.  3. Hyperlipidemia, on Lipitor.  4. Type 2 diabetes mellitus, on Janumet. Reports good glucose control overall.  5. History of thyroid disease, followed by endocrinologist. She is on methimazole with dose being  adjusted.  Current medicines were reviewed with the patient today.   Orders Placed This Encounter  Procedures  . ECHOCARDIOGRAM COMPLETE    Disposition: Call with test results.  Signed, Satira Sark, MD, [facility] 09/28/2016 1:46 PM    [facility] [place], [place], [place] [address] Phone: [phone]; Fax: [phone]

## 2016-09-28 ENCOUNTER — Ambulatory Visit (INDEPENDENT_AMBULATORY_CARE_PROVIDER_SITE_OTHER): Payer: PPO | Admitting: Cardiology

## 2016-09-28 ENCOUNTER — Encounter: Payer: Self-pay | Admitting: Cardiology

## 2016-09-28 ENCOUNTER — Telehealth: Payer: Self-pay | Admitting: Cardiology

## 2016-09-28 VITALS — BP 138/80 | HR 85 | Ht 66.0 in | Wt 193.8 lb

## 2016-09-28 DIAGNOSIS — R0602 Shortness of breath: Secondary | ICD-10-CM | POA: Diagnosis not present

## 2016-09-28 DIAGNOSIS — E782 Mixed hyperlipidemia: Secondary | ICD-10-CM

## 2016-09-28 DIAGNOSIS — R9431 Abnormal electrocardiogram [ECG] [EKG]: Secondary | ICD-10-CM | POA: Diagnosis not present

## 2016-09-28 DIAGNOSIS — I10 Essential (primary) hypertension: Secondary | ICD-10-CM | POA: Diagnosis not present

## 2016-09-28 DIAGNOSIS — Z720 Tobacco use: Secondary | ICD-10-CM

## 2016-09-28 DIAGNOSIS — E119 Type 2 diabetes mellitus without complications: Secondary | ICD-10-CM | POA: Diagnosis not present

## 2016-09-28 NOTE — Patient Instructions (Signed)
Your physician recommends that you schedule a follow-up appointment in: TO BE DETERMINED WITH DR [place]  Your physician recommends that you continue on your current medications as directed. Please refer to the Current Medication list given to you today.  Your physician has requested that you have an echocardiogram. Echocardiography is a painless test that uses sound waves to create images of your heart. It provides your doctor with information about the size and shape of your heart and how well your heart's chambers and valves are working. This procedure takes approximately one hour. There are no restrictions for this procedure.   Thank you for choosing [facility]!!

## 2016-09-28 NOTE — Telephone Encounter (Signed)
Pre-cert Verification for the following procedure   Echo scheduled for 10-01-16

## 2016-10-01 ENCOUNTER — Ambulatory Visit (HOSPITAL_COMMUNITY)
Admission: RE | Admit: 2016-10-01 | Discharge: 2016-10-01 | Disposition: A | Discharge status: Home / Self Care | Payer: PPO | Source: Ambulatory Visit | Attending: Cardiology | Admitting: Cardiology

## 2016-10-01 DIAGNOSIS — E785 Hyperlipidemia, unspecified: Secondary | ICD-10-CM | POA: Insufficient documentation

## 2016-10-01 DIAGNOSIS — I10 Essential (primary) hypertension: Secondary | ICD-10-CM | POA: Diagnosis not present

## 2016-10-01 DIAGNOSIS — Z720 Tobacco use: Secondary | ICD-10-CM | POA: Insufficient documentation

## 2016-10-01 DIAGNOSIS — R0602 Shortness of breath: Secondary | ICD-10-CM | POA: Insufficient documentation

## 2016-10-01 DIAGNOSIS — R9431 Abnormal electrocardiogram [ECG] [EKG]: Secondary | ICD-10-CM

## 2016-10-01 DIAGNOSIS — E119 Type 2 diabetes mellitus without complications: Secondary | ICD-10-CM | POA: Diagnosis not present

## 2016-10-01 NOTE — Progress Notes (Signed)
*  PRELIMINARY RESULTS* Echocardiogram 2D Echocardiogram has been performed.  Brandi Burke 10/01/2016, 3:00 PM

## 2016-10-02 ENCOUNTER — Telehealth: Payer: Self-pay

## 2016-10-02 NOTE — Telephone Encounter (Signed)
Patient notified. Routed to PCP 

## 2016-10-02 NOTE — Telephone Encounter (Signed)
-----   Message from Acquanetta Chain, LPN sent at 18/09/8419  9:20 AM EDT -----   ----- Message ----- From: Satira Sark, MD Sent: 10/02/2016   8:38 AM To: Celene Squibb, MD, Merlene Laughter, LPN  Results reviewed. LVEF is normal range without wall motion abnormalities. Recommend risk factor modification strategy with follow-up per PCP. No additional cardiac testing planned at this time. A copy of this test should be forwarded to Celene Squibb, MD.

## 2016-10-05 DIAGNOSIS — M1711 Unilateral primary osteoarthritis, right knee: Secondary | ICD-10-CM | POA: Diagnosis not present

## 2016-10-06 DIAGNOSIS — F418 Other specified anxiety disorders: Secondary | ICD-10-CM | POA: Diagnosis not present

## 2016-10-06 DIAGNOSIS — F39 Unspecified mood [affective] disorder: Secondary | ICD-10-CM | POA: Diagnosis not present

## 2016-10-06 DIAGNOSIS — Z720 Tobacco use: Secondary | ICD-10-CM | POA: Diagnosis not present

## 2016-10-06 DIAGNOSIS — E079 Disorder of thyroid, unspecified: Secondary | ICD-10-CM | POA: Diagnosis not present

## 2016-10-06 DIAGNOSIS — K5900 Constipation, unspecified: Secondary | ICD-10-CM | POA: Diagnosis not present

## 2016-10-06 DIAGNOSIS — R05 Cough: Secondary | ICD-10-CM | POA: Diagnosis not present

## 2016-10-06 DIAGNOSIS — F419 Anxiety disorder, unspecified: Secondary | ICD-10-CM | POA: Diagnosis not present

## 2016-10-06 DIAGNOSIS — M1991 Primary osteoarthritis, unspecified site: Secondary | ICD-10-CM | POA: Diagnosis not present

## 2016-10-06 DIAGNOSIS — J060 Acute laryngopharyngitis: Secondary | ICD-10-CM | POA: Diagnosis not present

## 2016-10-06 DIAGNOSIS — K921 Melena: Secondary | ICD-10-CM | POA: Diagnosis not present

## 2016-10-06 DIAGNOSIS — E1165 Type 2 diabetes mellitus with hyperglycemia: Secondary | ICD-10-CM | POA: Diagnosis not present

## 2016-10-06 DIAGNOSIS — E785 Hyperlipidemia, unspecified: Secondary | ICD-10-CM | POA: Diagnosis not present

## 2016-10-17 DIAGNOSIS — I10 Essential (primary) hypertension: Secondary | ICD-10-CM | POA: Diagnosis not present

## 2016-10-17 DIAGNOSIS — E0590 Thyrotoxicosis, unspecified without thyrotoxic crisis or storm: Secondary | ICD-10-CM | POA: Diagnosis not present

## 2016-10-17 DIAGNOSIS — E1169 Type 2 diabetes mellitus with other specified complication: Secondary | ICD-10-CM | POA: Diagnosis not present

## 2016-11-08 DIAGNOSIS — E782 Mixed hyperlipidemia: Secondary | ICD-10-CM | POA: Diagnosis not present

## 2016-11-08 DIAGNOSIS — I10 Essential (primary) hypertension: Secondary | ICD-10-CM | POA: Diagnosis not present

## 2016-11-08 DIAGNOSIS — E119 Type 2 diabetes mellitus without complications: Secondary | ICD-10-CM | POA: Diagnosis not present

## 2016-11-12 DIAGNOSIS — M1711 Unilateral primary osteoarthritis, right knee: Secondary | ICD-10-CM | POA: Diagnosis not present

## 2016-11-14 DIAGNOSIS — Z23 Encounter for immunization: Secondary | ICD-10-CM | POA: Diagnosis not present

## 2016-11-14 DIAGNOSIS — I10 Essential (primary) hypertension: Secondary | ICD-10-CM | POA: Diagnosis not present

## 2016-11-14 DIAGNOSIS — Z6830 Body mass index (BMI) 30.0-30.9, adult: Secondary | ICD-10-CM | POA: Diagnosis not present

## 2016-11-14 DIAGNOSIS — E079 Disorder of thyroid, unspecified: Secondary | ICD-10-CM | POA: Diagnosis not present

## 2016-11-14 DIAGNOSIS — E1165 Type 2 diabetes mellitus with hyperglycemia: Secondary | ICD-10-CM | POA: Diagnosis not present

## 2016-11-14 DIAGNOSIS — F418 Other specified anxiety disorders: Secondary | ICD-10-CM | POA: Diagnosis not present

## 2016-11-14 DIAGNOSIS — E785 Hyperlipidemia, unspecified: Secondary | ICD-10-CM | POA: Diagnosis not present

## 2016-11-14 DIAGNOSIS — Z720 Tobacco use: Secondary | ICD-10-CM | POA: Diagnosis not present

## 2016-11-14 DIAGNOSIS — M25569 Pain in unspecified knee: Secondary | ICD-10-CM | POA: Diagnosis not present

## 2016-11-19 DIAGNOSIS — M1711 Unilateral primary osteoarthritis, right knee: Secondary | ICD-10-CM | POA: Diagnosis not present

## 2016-11-26 DIAGNOSIS — M1711 Unilateral primary osteoarthritis, right knee: Secondary | ICD-10-CM | POA: Diagnosis not present

## 2016-12-04 DIAGNOSIS — M1812 Unilateral primary osteoarthritis of first carpometacarpal joint, left hand: Secondary | ICD-10-CM | POA: Diagnosis not present

## 2016-12-04 DIAGNOSIS — M65332 Trigger finger, left middle finger: Secondary | ICD-10-CM | POA: Diagnosis not present

## 2016-12-28 ENCOUNTER — Other Ambulatory Visit: Payer: Self-pay | Admitting: Adult Health

## 2017-01-07 DIAGNOSIS — M13842 Other specified arthritis, left hand: Secondary | ICD-10-CM | POA: Diagnosis not present

## 2017-01-07 DIAGNOSIS — M13841 Other specified arthritis, right hand: Secondary | ICD-10-CM | POA: Diagnosis not present

## 2017-01-07 DIAGNOSIS — M1711 Unilateral primary osteoarthritis, right knee: Secondary | ICD-10-CM | POA: Diagnosis not present

## 2017-02-06 DIAGNOSIS — K921 Melena: Secondary | ICD-10-CM | POA: Diagnosis not present

## 2017-02-06 DIAGNOSIS — K5900 Constipation, unspecified: Secondary | ICD-10-CM | POA: Diagnosis not present

## 2017-02-06 DIAGNOSIS — Z6830 Body mass index (BMI) 30.0-30.9, adult: Secondary | ICD-10-CM | POA: Diagnosis not present

## 2017-02-18 DIAGNOSIS — E0590 Thyrotoxicosis, unspecified without thyrotoxic crisis or storm: Secondary | ICD-10-CM | POA: Diagnosis not present

## 2017-02-18 DIAGNOSIS — E1169 Type 2 diabetes mellitus with other specified complication: Secondary | ICD-10-CM | POA: Diagnosis not present

## 2017-02-18 DIAGNOSIS — E119 Type 2 diabetes mellitus without complications: Secondary | ICD-10-CM | POA: Diagnosis not present

## 2017-02-18 DIAGNOSIS — Z79899 Other long term (current) drug therapy: Secondary | ICD-10-CM | POA: Diagnosis not present

## 2017-02-18 DIAGNOSIS — E785 Hyperlipidemia, unspecified: Secondary | ICD-10-CM | POA: Diagnosis not present

## 2017-02-19 ENCOUNTER — Other Ambulatory Visit: Payer: Self-pay | Admitting: Adult Health

## 2017-02-20 DIAGNOSIS — Z6831 Body mass index (BMI) 31.0-31.9, adult: Secondary | ICD-10-CM | POA: Diagnosis not present

## 2017-02-20 DIAGNOSIS — K5900 Constipation, unspecified: Secondary | ICD-10-CM | POA: Diagnosis not present

## 2017-03-18 DIAGNOSIS — F418 Other specified anxiety disorders: Secondary | ICD-10-CM | POA: Diagnosis not present

## 2017-03-18 DIAGNOSIS — E1165 Type 2 diabetes mellitus with hyperglycemia: Secondary | ICD-10-CM | POA: Diagnosis not present

## 2017-03-18 DIAGNOSIS — K5900 Constipation, unspecified: Secondary | ICD-10-CM | POA: Diagnosis not present

## 2017-03-18 DIAGNOSIS — F39 Unspecified mood [affective] disorder: Secondary | ICD-10-CM | POA: Diagnosis not present

## 2017-03-18 DIAGNOSIS — E785 Hyperlipidemia, unspecified: Secondary | ICD-10-CM | POA: Diagnosis not present

## 2017-03-18 DIAGNOSIS — Z720 Tobacco use: Secondary | ICD-10-CM | POA: Diagnosis not present

## 2017-03-18 DIAGNOSIS — M25569 Pain in unspecified knee: Secondary | ICD-10-CM | POA: Diagnosis not present

## 2017-03-18 DIAGNOSIS — K921 Melena: Secondary | ICD-10-CM | POA: Diagnosis not present

## 2017-03-18 DIAGNOSIS — M1991 Primary osteoarthritis, unspecified site: Secondary | ICD-10-CM | POA: Diagnosis not present

## 2017-03-18 DIAGNOSIS — Z6830 Body mass index (BMI) 30.0-30.9, adult: Secondary | ICD-10-CM | POA: Diagnosis not present

## 2017-03-18 DIAGNOSIS — F419 Anxiety disorder, unspecified: Secondary | ICD-10-CM | POA: Diagnosis not present

## 2017-03-18 DIAGNOSIS — E079 Disorder of thyroid, unspecified: Secondary | ICD-10-CM | POA: Diagnosis not present

## 2017-03-20 DIAGNOSIS — I10 Essential (primary) hypertension: Secondary | ICD-10-CM | POA: Diagnosis not present

## 2017-03-20 DIAGNOSIS — F419 Anxiety disorder, unspecified: Secondary | ICD-10-CM | POA: Diagnosis not present

## 2017-03-20 DIAGNOSIS — F39 Unspecified mood [affective] disorder: Secondary | ICD-10-CM | POA: Diagnosis not present

## 2017-03-20 DIAGNOSIS — E119 Type 2 diabetes mellitus without complications: Secondary | ICD-10-CM | POA: Diagnosis not present

## 2017-03-20 DIAGNOSIS — E782 Mixed hyperlipidemia: Secondary | ICD-10-CM | POA: Diagnosis not present

## 2017-03-20 DIAGNOSIS — R946 Abnormal results of thyroid function studies: Secondary | ICD-10-CM | POA: Diagnosis not present

## 2017-03-20 DIAGNOSIS — M1991 Primary osteoarthritis, unspecified site: Secondary | ICD-10-CM | POA: Diagnosis not present

## 2017-03-20 DIAGNOSIS — Z6831 Body mass index (BMI) 31.0-31.9, adult: Secondary | ICD-10-CM | POA: Diagnosis not present

## 2017-03-20 DIAGNOSIS — R944 Abnormal results of kidney function studies: Secondary | ICD-10-CM | POA: Diagnosis not present

## 2017-05-01 DIAGNOSIS — M170 Bilateral primary osteoarthritis of knee: Secondary | ICD-10-CM | POA: Diagnosis not present

## 2017-05-01 DIAGNOSIS — M13841 Other specified arthritis, right hand: Secondary | ICD-10-CM | POA: Diagnosis not present

## 2017-05-01 DIAGNOSIS — M13842 Other specified arthritis, left hand: Secondary | ICD-10-CM | POA: Diagnosis not present

## 2017-05-20 ENCOUNTER — Other Ambulatory Visit: Payer: PPO | Admitting: Adult Health

## 2017-06-17 DIAGNOSIS — E119 Type 2 diabetes mellitus without complications: Secondary | ICD-10-CM | POA: Diagnosis not present

## 2017-06-17 DIAGNOSIS — E0590 Thyrotoxicosis, unspecified without thyrotoxic crisis or storm: Secondary | ICD-10-CM | POA: Diagnosis not present

## 2017-06-17 DIAGNOSIS — I10 Essential (primary) hypertension: Secondary | ICD-10-CM | POA: Diagnosis not present

## 2017-06-17 DIAGNOSIS — Z6831 Body mass index (BMI) 31.0-31.9, adult: Secondary | ICD-10-CM | POA: Diagnosis not present

## 2017-06-20 ENCOUNTER — Ambulatory Visit: Payer: PPO | Admitting: Adult Health

## 2017-06-20 ENCOUNTER — Encounter (INDEPENDENT_AMBULATORY_CARE_PROVIDER_SITE_OTHER): Payer: Self-pay

## 2017-06-20 ENCOUNTER — Encounter: Payer: Self-pay | Admitting: Adult Health

## 2017-06-20 VITALS — BP 115/60 | HR 84 | Ht 66.0 in | Wt 194.0 lb

## 2017-06-20 DIAGNOSIS — N816 Rectocele: Secondary | ICD-10-CM

## 2017-06-20 DIAGNOSIS — N362 Urethral caruncle: Secondary | ICD-10-CM | POA: Insufficient documentation

## 2017-06-20 DIAGNOSIS — R6882 Decreased libido: Secondary | ICD-10-CM | POA: Diagnosis not present

## 2017-06-20 DIAGNOSIS — Z1212 Encounter for screening for malignant neoplasm of rectum: Secondary | ICD-10-CM | POA: Diagnosis not present

## 2017-06-20 DIAGNOSIS — Z1211 Encounter for screening for malignant neoplasm of colon: Secondary | ICD-10-CM

## 2017-06-20 DIAGNOSIS — Z01411 Encounter for gynecological examination (general) (routine) with abnormal findings: Secondary | ICD-10-CM

## 2017-06-20 DIAGNOSIS — Z01419 Encounter for gynecological examination (general) (routine) without abnormal findings: Secondary | ICD-10-CM | POA: Insufficient documentation

## 2017-06-20 LAB — HEMOCCULT GUIAC POC 1CARD (OFFICE): Fecal Occult Blood, POC: NEGATIVE

## 2017-06-20 MED ORDER — IBUPROFEN 800 MG PO TABS: ORAL_TABLET | ORAL | 3 refills | Status: DC

## 2017-06-20 NOTE — Progress Notes (Signed)
Patient ID: Brandi Burke, female   DOB: [birth year], 67 y.o.   MRN: [id number] History of Present Illness: Brandi Burke is a 67 year old white female, PM in for a well woman gyn exam,she had a normal pap with negative HPV 05/06/15.She is requesting refills on Motrin 800 mg, she uses for aches in hands and knees at times.  PCP is Dr Brandi Burke.   Current Medications, Allergies, Past Medical History, Past Surgical History, Family History and Social History were reviewed in [employer] record.     Review of Systems: Patient denies any headaches, hearing loss, fatigue, blurred vision, shortness of breath, chest pain, abdominal pain, problems with bowel movements(occasional constipation), urination, or intercourse. 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 She has decreased libido.     Physical Exam:BP 115/60 (BP Location: Left Arm, Patient Position: Sitting, Cuff Size: Small)   Pulse 84   Ht 5\' 6"  (1.676 m)   Wt 194 lb (88 kg)   BMI 31.31 kg/m  General:  Well developed, well nourished, no acute distress Skin:  Warm and dry Neck:  Midline trachea, normal thyroid, good ROM, no lymphadenopathy,no carotid bruits heard Lungs; Clear to auscultation bilaterally Breast:  No dominant palpable mass, retraction, or nipple discharge Cardiovascular: Regular rate and rhythm Abdomen:  Soft, non tender, no hepatosplenomegaly,has navel ring Pelvic:  External genitalia is normal in appearance, no lesions.  The vagina has loss of color, moisture and rugae.Marland Kitchen Urethra has caruncle present. The cervix is smooth.  Uterus is felt to be normal size, shape, and contour.  No adnexal masses or tenderness noted.Bladder is non tender, no masses felt. Rectal: Good sphincter tone, no polyps, or hemorrhoids felt.  Hemoccult negative.+rectocele Extremities/musculoskeletal:  No swelling or varicosities noted, no clubbing or cyanosis Psych:  No mood changes, alert  and cooperative,seems happy PHQ 9 score 1.  Impression: 1. Encounter for well woman exam with routine gynecological exam   2. Screening for colorectal cancer   3. Rectocele   4. Urethral caruncle   5. Decreased libido       Plan: Pap and physical in 2 years Labs with PCP Mammogram yearly Colonoscopy  Per GI Meds ordered this encounter  Medications  . ibuprofen (ADVIL,MOTRIN) 800 MG tablet    Sig: Take 1 every 8 hours prn pain    Dispense:  60 tablet    Refill:  3    Order Specific Question:   Supervising Provider    Answer:   Florian Buff [[id number]]  She thinks she has had DEXA, will check with PCP

## 2017-07-16 DIAGNOSIS — M1711 Unilateral primary osteoarthritis, right knee: Secondary | ICD-10-CM | POA: Diagnosis not present

## 2017-07-22 DIAGNOSIS — M1711 Unilateral primary osteoarthritis, right knee: Secondary | ICD-10-CM | POA: Diagnosis not present

## 2017-07-29 DIAGNOSIS — M1711 Unilateral primary osteoarthritis, right knee: Secondary | ICD-10-CM | POA: Diagnosis not present

## 2017-07-30 DIAGNOSIS — M2041 Other hammer toe(s) (acquired), right foot: Secondary | ICD-10-CM | POA: Diagnosis not present

## 2017-07-30 DIAGNOSIS — M2042 Other hammer toe(s) (acquired), left foot: Secondary | ICD-10-CM | POA: Diagnosis not present

## 2017-07-30 DIAGNOSIS — M7661 Achilles tendinitis, right leg: Secondary | ICD-10-CM | POA: Diagnosis not present

## 2017-07-30 DIAGNOSIS — E139 Other specified diabetes mellitus without complications: Secondary | ICD-10-CM | POA: Diagnosis not present

## 2017-08-02 ENCOUNTER — Other Ambulatory Visit (HOSPITAL_COMMUNITY): Payer: Self-pay | Admitting: Internal Medicine

## 2017-08-02 DIAGNOSIS — Z1231 Encounter for screening mammogram for malignant neoplasm of breast: Secondary | ICD-10-CM

## 2017-08-12 ENCOUNTER — Telehealth: Payer: Self-pay | Admitting: Obstetrics and Gynecology

## 2017-08-13 ENCOUNTER — Other Ambulatory Visit: Payer: Self-pay | Admitting: *Deleted

## 2017-08-13 DIAGNOSIS — Z780 Asymptomatic menopausal state: Secondary | ICD-10-CM

## 2017-08-13 NOTE — Telephone Encounter (Signed)
Bone density scan scheduled for 09/04/17 @ 1pm following mammogram.

## 2017-08-20 DIAGNOSIS — M7661 Achilles tendinitis, right leg: Secondary | ICD-10-CM | POA: Diagnosis not present

## 2017-08-20 DIAGNOSIS — L84 Corns and callosities: Secondary | ICD-10-CM | POA: Diagnosis not present

## 2017-08-20 DIAGNOSIS — E139 Other specified diabetes mellitus without complications: Secondary | ICD-10-CM | POA: Diagnosis not present

## 2017-08-20 DIAGNOSIS — M2042 Other hammer toe(s) (acquired), left foot: Secondary | ICD-10-CM | POA: Diagnosis not present

## 2017-08-21 DIAGNOSIS — G8929 Other chronic pain: Secondary | ICD-10-CM | POA: Diagnosis not present

## 2017-08-21 DIAGNOSIS — E139 Other specified diabetes mellitus without complications: Secondary | ICD-10-CM | POA: Diagnosis not present

## 2017-08-21 DIAGNOSIS — Z01818 Encounter for other preprocedural examination: Secondary | ICD-10-CM | POA: Diagnosis not present

## 2017-08-26 DIAGNOSIS — M2042 Other hammer toe(s) (acquired), left foot: Secondary | ICD-10-CM | POA: Diagnosis not present

## 2017-08-29 DIAGNOSIS — M2042 Other hammer toe(s) (acquired), left foot: Secondary | ICD-10-CM | POA: Diagnosis not present

## 2017-09-04 ENCOUNTER — Ambulatory Visit (HOSPITAL_COMMUNITY)
Admission: RE | Admit: 2017-09-04 | Discharge: 2017-09-04 | Disposition: A | Discharge status: Home / Self Care | Payer: PPO | Source: Ambulatory Visit | Attending: Adult Health | Admitting: Adult Health

## 2017-09-04 ENCOUNTER — Encounter (HOSPITAL_COMMUNITY): Payer: Self-pay

## 2017-09-04 ENCOUNTER — Ambulatory Visit (HOSPITAL_COMMUNITY)
Admission: RE | Admit: 2017-09-04 | Discharge: 2017-09-04 | Disposition: A | Discharge status: Home / Self Care | Payer: PPO | Source: Ambulatory Visit | Attending: Internal Medicine | Admitting: Internal Medicine

## 2017-09-04 DIAGNOSIS — Z1231 Encounter for screening mammogram for malignant neoplasm of breast: Secondary | ICD-10-CM | POA: Diagnosis not present

## 2017-09-04 DIAGNOSIS — Z780 Asymptomatic menopausal state: Secondary | ICD-10-CM | POA: Diagnosis not present

## 2017-09-04 DIAGNOSIS — Z13820 Encounter for screening for osteoporosis: Secondary | ICD-10-CM | POA: Insufficient documentation

## 2017-09-05 ENCOUNTER — Telehealth: Payer: Self-pay | Admitting: Adult Health

## 2017-09-05 NOTE — Telephone Encounter (Signed)
Pt aware that DEXA was normal, has DJD L4 and mammogram was normal

## 2017-09-18 DIAGNOSIS — Z720 Tobacco use: Secondary | ICD-10-CM | POA: Diagnosis not present

## 2017-09-18 DIAGNOSIS — M25569 Pain in unspecified knee: Secondary | ICD-10-CM | POA: Diagnosis not present

## 2017-09-18 DIAGNOSIS — F418 Other specified anxiety disorders: Secondary | ICD-10-CM | POA: Diagnosis not present

## 2017-09-18 DIAGNOSIS — E1165 Type 2 diabetes mellitus with hyperglycemia: Secondary | ICD-10-CM | POA: Diagnosis not present

## 2017-09-18 DIAGNOSIS — M1991 Primary osteoarthritis, unspecified site: Secondary | ICD-10-CM | POA: Diagnosis not present

## 2017-09-18 DIAGNOSIS — F419 Anxiety disorder, unspecified: Secondary | ICD-10-CM | POA: Diagnosis not present

## 2017-09-18 DIAGNOSIS — E079 Disorder of thyroid, unspecified: Secondary | ICD-10-CM | POA: Diagnosis not present

## 2017-09-18 DIAGNOSIS — Z6830 Body mass index (BMI) 30.0-30.9, adult: Secondary | ICD-10-CM | POA: Diagnosis not present

## 2017-09-18 DIAGNOSIS — F39 Unspecified mood [affective] disorder: Secondary | ICD-10-CM | POA: Diagnosis not present

## 2017-09-18 DIAGNOSIS — K921 Melena: Secondary | ICD-10-CM | POA: Diagnosis not present

## 2017-09-18 DIAGNOSIS — K5900 Constipation, unspecified: Secondary | ICD-10-CM | POA: Diagnosis not present

## 2017-09-18 DIAGNOSIS — E785 Hyperlipidemia, unspecified: Secondary | ICD-10-CM | POA: Diagnosis not present

## 2017-09-20 DIAGNOSIS — E079 Disorder of thyroid, unspecified: Secondary | ICD-10-CM | POA: Diagnosis not present

## 2017-09-20 DIAGNOSIS — Z720 Tobacco use: Secondary | ICD-10-CM | POA: Diagnosis not present

## 2017-09-20 DIAGNOSIS — Z0001 Encounter for general adult medical examination with abnormal findings: Secondary | ICD-10-CM | POA: Diagnosis not present

## 2017-09-20 DIAGNOSIS — E1165 Type 2 diabetes mellitus with hyperglycemia: Secondary | ICD-10-CM | POA: Diagnosis not present

## 2017-09-20 DIAGNOSIS — Z23 Encounter for immunization: Secondary | ICD-10-CM | POA: Diagnosis not present

## 2017-09-20 DIAGNOSIS — Z6831 Body mass index (BMI) 31.0-31.9, adult: Secondary | ICD-10-CM | POA: Diagnosis not present

## 2017-09-20 DIAGNOSIS — M25569 Pain in unspecified knee: Secondary | ICD-10-CM | POA: Diagnosis not present

## 2017-09-20 DIAGNOSIS — E782 Mixed hyperlipidemia: Secondary | ICD-10-CM | POA: Diagnosis not present

## 2017-09-20 DIAGNOSIS — M1991 Primary osteoarthritis, unspecified site: Secondary | ICD-10-CM | POA: Diagnosis not present

## 2017-09-20 DIAGNOSIS — F39 Unspecified mood [affective] disorder: Secondary | ICD-10-CM | POA: Diagnosis not present

## 2017-09-20 DIAGNOSIS — F419 Anxiety disorder, unspecified: Secondary | ICD-10-CM | POA: Diagnosis not present

## 2017-09-20 DIAGNOSIS — I10 Essential (primary) hypertension: Secondary | ICD-10-CM | POA: Diagnosis not present

## 2017-10-17 DIAGNOSIS — Z6831 Body mass index (BMI) 31.0-31.9, adult: Secondary | ICD-10-CM | POA: Diagnosis not present

## 2017-10-17 DIAGNOSIS — E0590 Thyrotoxicosis, unspecified without thyrotoxic crisis or storm: Secondary | ICD-10-CM | POA: Diagnosis not present

## 2017-10-17 DIAGNOSIS — I10 Essential (primary) hypertension: Secondary | ICD-10-CM | POA: Diagnosis not present

## 2017-10-17 DIAGNOSIS — E119 Type 2 diabetes mellitus without complications: Secondary | ICD-10-CM | POA: Diagnosis not present

## 2017-11-04 DIAGNOSIS — J019 Acute sinusitis, unspecified: Secondary | ICD-10-CM | POA: Diagnosis not present

## 2017-12-28 DIAGNOSIS — M62838 Other muscle spasm: Secondary | ICD-10-CM | POA: Diagnosis not present

## 2017-12-28 DIAGNOSIS — M542 Cervicalgia: Secondary | ICD-10-CM | POA: Diagnosis not present

## 2018-01-16 DIAGNOSIS — Z79899 Other long term (current) drug therapy: Secondary | ICD-10-CM | POA: Diagnosis not present

## 2018-01-16 DIAGNOSIS — E0590 Thyrotoxicosis, unspecified without thyrotoxic crisis or storm: Secondary | ICD-10-CM | POA: Diagnosis not present

## 2018-01-16 DIAGNOSIS — E1169 Type 2 diabetes mellitus with other specified complication: Secondary | ICD-10-CM | POA: Diagnosis not present

## 2018-01-16 DIAGNOSIS — E119 Type 2 diabetes mellitus without complications: Secondary | ICD-10-CM | POA: Diagnosis not present

## 2018-03-17 DIAGNOSIS — J069 Acute upper respiratory infection, unspecified: Secondary | ICD-10-CM | POA: Diagnosis not present

## 2018-03-18 ENCOUNTER — Other Ambulatory Visit: Payer: Self-pay

## 2018-03-18 DIAGNOSIS — R6889 Other general symptoms and signs: Secondary | ICD-10-CM

## 2018-03-24 LAB — NOVEL CORONAVIRUS, NAA: SARS-COV-2, NAA: NOT DETECTED

## 2018-03-25 ENCOUNTER — Telehealth (HOSPITAL_COMMUNITY): Payer: Self-pay | Admitting: Emergency Medicine

## 2018-03-25 NOTE — Telephone Encounter (Signed)
Your test for COVID-19 was negative.  Please continue good preventive care measures, including:  frequent hand-washing, avoid touching your face, cover coughs/sneezes, stay out of crowds and keep a 6 foot distance from others.  If you develop fever/cough/breathlessness, please stay home for 7 days and until you have had 3 consecutive days with cough/breathlessness improving and without fever (without taking a fever reducer). Go to the nearest hospital ED tent for assessment if fever/cough/breathlessness are severe or illness seems like a threat to life.  Attempted call no answer [facility]

## 2018-03-31 ENCOUNTER — Telehealth (HOSPITAL_COMMUNITY): Payer: Self-pay | Admitting: Emergency Medicine

## 2018-03-31 NOTE — Telephone Encounter (Signed)
Patient contacted and made aware of all results, all questions answered.   

## 2018-04-02 DIAGNOSIS — I10 Essential (primary) hypertension: Secondary | ICD-10-CM | POA: Diagnosis not present

## 2018-04-02 DIAGNOSIS — E1169 Type 2 diabetes mellitus with other specified complication: Secondary | ICD-10-CM | POA: Diagnosis not present

## 2018-04-02 DIAGNOSIS — Z720 Tobacco use: Secondary | ICD-10-CM | POA: Diagnosis not present

## 2018-04-02 DIAGNOSIS — E785 Hyperlipidemia, unspecified: Secondary | ICD-10-CM | POA: Diagnosis not present

## 2018-04-28 DIAGNOSIS — E0590 Thyrotoxicosis, unspecified without thyrotoxic crisis or storm: Secondary | ICD-10-CM | POA: Diagnosis not present

## 2018-04-28 DIAGNOSIS — E1169 Type 2 diabetes mellitus with other specified complication: Secondary | ICD-10-CM | POA: Diagnosis not present

## 2018-04-28 DIAGNOSIS — I10 Essential (primary) hypertension: Secondary | ICD-10-CM | POA: Diagnosis not present

## 2018-04-28 DIAGNOSIS — E119 Type 2 diabetes mellitus without complications: Secondary | ICD-10-CM | POA: Diagnosis not present

## 2018-04-30 ENCOUNTER — Other Ambulatory Visit: Payer: Self-pay | Admitting: Adult Health

## 2018-05-07 DIAGNOSIS — Z0000 Encounter for general adult medical examination without abnormal findings: Secondary | ICD-10-CM | POA: Diagnosis not present

## 2018-05-08 ENCOUNTER — Other Ambulatory Visit: Payer: Self-pay | Admitting: Internal Medicine

## 2018-05-08 DIAGNOSIS — F17210 Nicotine dependence, cigarettes, uncomplicated: Secondary | ICD-10-CM

## 2018-07-30 ENCOUNTER — Other Ambulatory Visit: Payer: Self-pay

## 2018-07-30 NOTE — Patient Outreach (Signed)
[place] [facility]) Care Management  07/30/2018  Brandi Burke 07/27/1950 [id number]   Medication Adherence call to [name] Compliant Voice message left with a call back number. [name] is showing past due on Atorvastatin 20 mg under [name].   [place] Management Direct Dial [phone]  Fax [phone] [name]

## 2018-08-04 DIAGNOSIS — E1169 Type 2 diabetes mellitus with other specified complication: Secondary | ICD-10-CM | POA: Diagnosis not present

## 2018-08-04 DIAGNOSIS — E119 Type 2 diabetes mellitus without complications: Secondary | ICD-10-CM | POA: Diagnosis not present

## 2018-08-04 DIAGNOSIS — E785 Hyperlipidemia, unspecified: Secondary | ICD-10-CM | POA: Diagnosis not present

## 2018-08-04 DIAGNOSIS — E0590 Thyrotoxicosis, unspecified without thyrotoxic crisis or storm: Secondary | ICD-10-CM | POA: Diagnosis not present

## 2018-08-04 DIAGNOSIS — I10 Essential (primary) hypertension: Secondary | ICD-10-CM | POA: Diagnosis not present

## 2018-08-18 ENCOUNTER — Other Ambulatory Visit (HOSPITAL_COMMUNITY): Payer: Self-pay | Admitting: Internal Medicine

## 2018-08-18 ENCOUNTER — Other Ambulatory Visit: Payer: Self-pay

## 2018-08-18 ENCOUNTER — Ambulatory Visit (HOSPITAL_COMMUNITY)
Admission: RE | Admit: 2018-08-18 | Discharge: 2018-08-18 | Disposition: A | Discharge status: Home / Self Care | Payer: Medicare Other | Source: Ambulatory Visit | Attending: Internal Medicine | Admitting: Internal Medicine

## 2018-08-18 ENCOUNTER — Other Ambulatory Visit: Payer: Self-pay | Admitting: Internal Medicine

## 2018-08-18 DIAGNOSIS — I714 Abdominal aortic aneurysm, without rupture: Secondary | ICD-10-CM | POA: Insufficient documentation

## 2018-08-18 DIAGNOSIS — Z1231 Encounter for screening mammogram for malignant neoplasm of breast: Secondary | ICD-10-CM

## 2018-08-18 DIAGNOSIS — F17210 Nicotine dependence, cigarettes, uncomplicated: Secondary | ICD-10-CM

## 2018-08-18 DIAGNOSIS — Z87891 Personal history of nicotine dependence: Secondary | ICD-10-CM | POA: Diagnosis not present

## 2018-09-15 ENCOUNTER — Ambulatory Visit (HOSPITAL_COMMUNITY): Payer: PPO

## 2018-09-15 ENCOUNTER — Ambulatory Visit (HOSPITAL_COMMUNITY)
Admission: RE | Admit: 2018-09-15 | Discharge: 2018-09-15 | Disposition: A | Discharge status: Home / Self Care | Payer: Medicare Other | Source: Ambulatory Visit | Attending: Internal Medicine | Admitting: Internal Medicine

## 2018-09-15 ENCOUNTER — Other Ambulatory Visit: Payer: Self-pay

## 2018-09-15 ENCOUNTER — Ambulatory Visit (HOSPITAL_COMMUNITY): Payer: Medicare Other

## 2018-09-15 DIAGNOSIS — M13842 Other specified arthritis, left hand: Secondary | ICD-10-CM | POA: Diagnosis not present

## 2018-09-15 DIAGNOSIS — Z1231 Encounter for screening mammogram for malignant neoplasm of breast: Secondary | ICD-10-CM | POA: Diagnosis not present

## 2018-09-23 DIAGNOSIS — S161XXA Strain of muscle, fascia and tendon at neck level, initial encounter: Secondary | ICD-10-CM | POA: Diagnosis not present

## 2018-09-25 DIAGNOSIS — S161XXA Strain of muscle, fascia and tendon at neck level, initial encounter: Secondary | ICD-10-CM | POA: Diagnosis not present

## 2018-09-25 DIAGNOSIS — M542 Cervicalgia: Secondary | ICD-10-CM | POA: Diagnosis not present

## 2018-10-01 ENCOUNTER — Other Ambulatory Visit: Payer: Self-pay

## 2018-10-01 NOTE — Patient Outreach (Signed)
[place] [facility]) Care Management  10/01/2018  Brandi Burke 1950-07-18 [id number]   Medication Adherence call to [name] Compliant Voice message left with a call back number. [name] is showing past due on Janumet Xr 100/1000 mg under [place].   [place] Management Direct Dial [phone]  Fax [phone] [name]

## 2018-10-31 ENCOUNTER — Other Ambulatory Visit: Payer: Self-pay

## 2018-10-31 NOTE — Patient Outreach (Signed)
[name] [facility]) Care Management  10/31/2018  Brandi Burke 1950/01/08 [id number]   Medication Adherence call to [place] Voice message left with a call back number.[name] is past due on Janumet 100/1000 mg under [place].   [name] Management Direct Dial [phone]  Fax [phone] [name]

## 2018-11-07 ENCOUNTER — Other Ambulatory Visit: Payer: Self-pay

## 2018-11-07 NOTE — Patient Outreach (Signed)
[place] [facility]) Care Management  11/07/2018  Brandi Burke 1950/06/27 [id number]   Medication Adherence call to Mrs. [place] Compliant Voice message left with a call back number. [name] is showing past due on Janumet Xr 100/1000 mg under [place].   [facility] Direct Dial [phone]  Fax [phone] [name]

## 2018-11-10 DIAGNOSIS — E1159 Type 2 diabetes mellitus with other circulatory complications: Secondary | ICD-10-CM | POA: Diagnosis not present

## 2018-11-10 DIAGNOSIS — E119 Type 2 diabetes mellitus without complications: Secondary | ICD-10-CM | POA: Diagnosis not present

## 2018-11-10 DIAGNOSIS — I10 Essential (primary) hypertension: Secondary | ICD-10-CM | POA: Diagnosis not present

## 2018-11-10 DIAGNOSIS — Z23 Encounter for immunization: Secondary | ICD-10-CM | POA: Diagnosis not present

## 2018-11-10 DIAGNOSIS — E0590 Thyrotoxicosis, unspecified without thyrotoxic crisis or storm: Secondary | ICD-10-CM | POA: Diagnosis not present

## 2019-01-01 DIAGNOSIS — E1169 Type 2 diabetes mellitus with other specified complication: Secondary | ICD-10-CM | POA: Diagnosis not present

## 2019-01-01 DIAGNOSIS — E0590 Thyrotoxicosis, unspecified without thyrotoxic crisis or storm: Secondary | ICD-10-CM | POA: Diagnosis not present

## 2019-01-01 DIAGNOSIS — M1991 Primary osteoarthritis, unspecified site: Secondary | ICD-10-CM | POA: Diagnosis not present

## 2019-01-14 ENCOUNTER — Other Ambulatory Visit: Payer: Self-pay | Admitting: Adult Health

## 2019-02-10 DIAGNOSIS — E119 Type 2 diabetes mellitus without complications: Secondary | ICD-10-CM | POA: Diagnosis not present

## 2019-02-10 DIAGNOSIS — E1169 Type 2 diabetes mellitus with other specified complication: Secondary | ICD-10-CM | POA: Diagnosis not present

## 2019-02-10 DIAGNOSIS — I10 Essential (primary) hypertension: Secondary | ICD-10-CM | POA: Diagnosis not present

## 2019-02-10 DIAGNOSIS — E1159 Type 2 diabetes mellitus with other circulatory complications: Secondary | ICD-10-CM | POA: Diagnosis not present

## 2019-02-10 DIAGNOSIS — E0590 Thyrotoxicosis, unspecified without thyrotoxic crisis or storm: Secondary | ICD-10-CM | POA: Diagnosis not present

## 2019-02-10 DIAGNOSIS — E785 Hyperlipidemia, unspecified: Secondary | ICD-10-CM | POA: Diagnosis not present

## 2019-02-23 DIAGNOSIS — M25561 Pain in right knee: Secondary | ICD-10-CM | POA: Diagnosis not present

## 2019-02-23 DIAGNOSIS — M170 Bilateral primary osteoarthritis of knee: Secondary | ICD-10-CM | POA: Diagnosis not present

## 2019-02-23 DIAGNOSIS — M25562 Pain in left knee: Secondary | ICD-10-CM | POA: Diagnosis not present

## 2019-04-13 DIAGNOSIS — M170 Bilateral primary osteoarthritis of knee: Secondary | ICD-10-CM | POA: Diagnosis not present

## 2019-04-20 DIAGNOSIS — M170 Bilateral primary osteoarthritis of knee: Secondary | ICD-10-CM | POA: Diagnosis not present

## 2019-04-27 DIAGNOSIS — M170 Bilateral primary osteoarthritis of knee: Secondary | ICD-10-CM | POA: Diagnosis not present

## 2019-05-13 DIAGNOSIS — E1159 Type 2 diabetes mellitus with other circulatory complications: Secondary | ICD-10-CM | POA: Diagnosis not present

## 2019-05-13 DIAGNOSIS — E119 Type 2 diabetes mellitus without complications: Secondary | ICD-10-CM | POA: Diagnosis not present

## 2019-05-13 DIAGNOSIS — E0590 Thyrotoxicosis, unspecified without thyrotoxic crisis or storm: Secondary | ICD-10-CM | POA: Diagnosis not present

## 2019-05-13 DIAGNOSIS — Z0000 Encounter for general adult medical examination without abnormal findings: Secondary | ICD-10-CM | POA: Diagnosis not present

## 2019-05-13 DIAGNOSIS — I152 Hypertension secondary to endocrine disorders: Secondary | ICD-10-CM | POA: Diagnosis not present

## 2019-06-15 DIAGNOSIS — M1711 Unilateral primary osteoarthritis, right knee: Secondary | ICD-10-CM | POA: Diagnosis not present

## 2019-08-05 DIAGNOSIS — I152 Hypertension secondary to endocrine disorders: Secondary | ICD-10-CM | POA: Diagnosis not present

## 2019-08-05 DIAGNOSIS — E1169 Type 2 diabetes mellitus with other specified complication: Secondary | ICD-10-CM | POA: Diagnosis not present

## 2019-08-05 DIAGNOSIS — E0590 Thyrotoxicosis, unspecified without thyrotoxic crisis or storm: Secondary | ICD-10-CM | POA: Diagnosis not present

## 2019-08-05 DIAGNOSIS — E1159 Type 2 diabetes mellitus with other circulatory complications: Secondary | ICD-10-CM | POA: Diagnosis not present

## 2019-08-05 DIAGNOSIS — E119 Type 2 diabetes mellitus without complications: Secondary | ICD-10-CM | POA: Diagnosis not present

## 2019-08-24 DIAGNOSIS — I10 Essential (primary) hypertension: Secondary | ICD-10-CM | POA: Diagnosis not present

## 2019-08-24 DIAGNOSIS — R0683 Snoring: Secondary | ICD-10-CM | POA: Diagnosis not present

## 2019-08-24 DIAGNOSIS — R0681 Apnea, not elsewhere classified: Secondary | ICD-10-CM | POA: Diagnosis not present

## 2019-08-24 DIAGNOSIS — R29818 Other symptoms and signs involving the nervous system: Secondary | ICD-10-CM | POA: Diagnosis not present

## 2019-09-25 DIAGNOSIS — G4733 Obstructive sleep apnea (adult) (pediatric): Secondary | ICD-10-CM | POA: Diagnosis not present

## 2019-09-30 DIAGNOSIS — I10 Essential (primary) hypertension: Secondary | ICD-10-CM | POA: Diagnosis not present

## 2019-09-30 DIAGNOSIS — G4733 Obstructive sleep apnea (adult) (pediatric): Secondary | ICD-10-CM | POA: Diagnosis not present

## 2019-11-11 DIAGNOSIS — E119 Type 2 diabetes mellitus without complications: Secondary | ICD-10-CM | POA: Diagnosis not present

## 2019-11-11 DIAGNOSIS — E1169 Type 2 diabetes mellitus with other specified complication: Secondary | ICD-10-CM | POA: Diagnosis not present

## 2019-11-11 DIAGNOSIS — E559 Vitamin D deficiency, unspecified: Secondary | ICD-10-CM | POA: Diagnosis not present

## 2019-11-11 DIAGNOSIS — E0590 Thyrotoxicosis, unspecified without thyrotoxic crisis or storm: Secondary | ICD-10-CM | POA: Diagnosis not present

## 2019-12-15 ENCOUNTER — Other Ambulatory Visit: Payer: Self-pay | Admitting: Adult Health

## 2020-09-19 IMAGING — MG MM DIGITAL SCREENING BILAT W/ TOMO W/ CAD
6 of 10 series · 6 of 30 positions shown · non-contrast
Comparison: Previous exam(s).

CLINICAL DATA: Screening. Strong family history breast cancer,
including in the patient's mother as well as sisters.

EXAM:
DIGITAL SCREENING BILATERAL MAMMOGRAM WITH TOMO AND CAD

[L CC synth-2D]
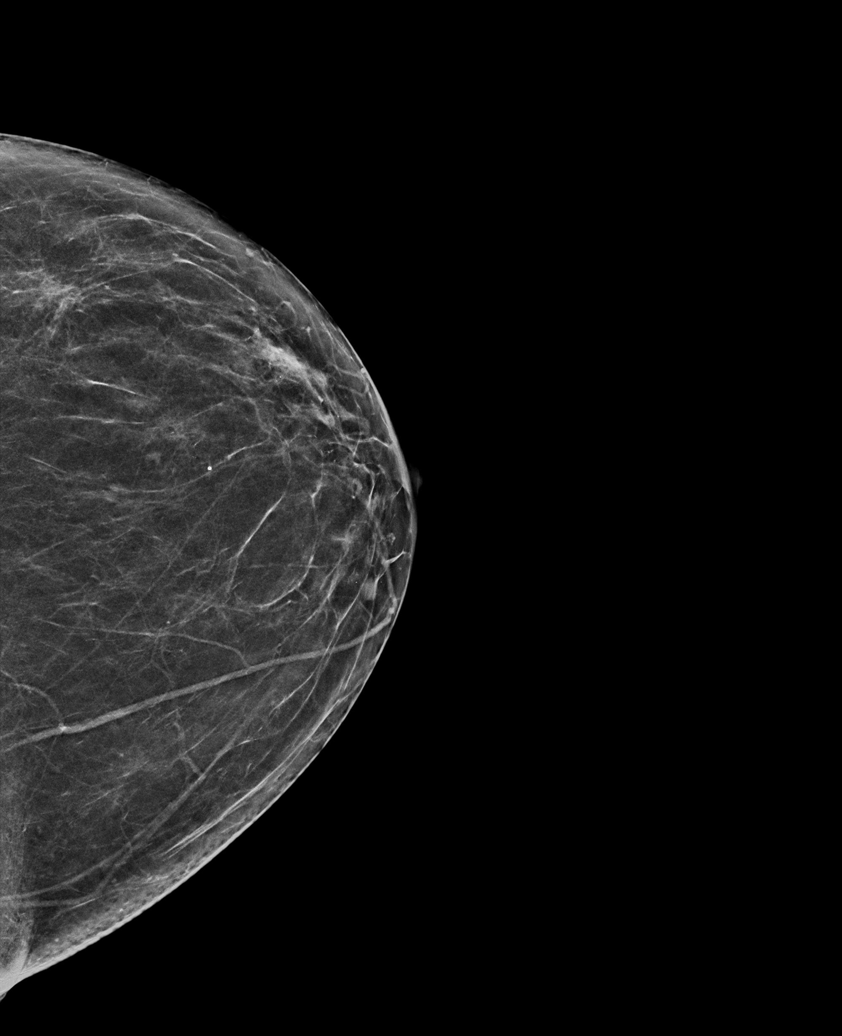

[R CC synth-2D]
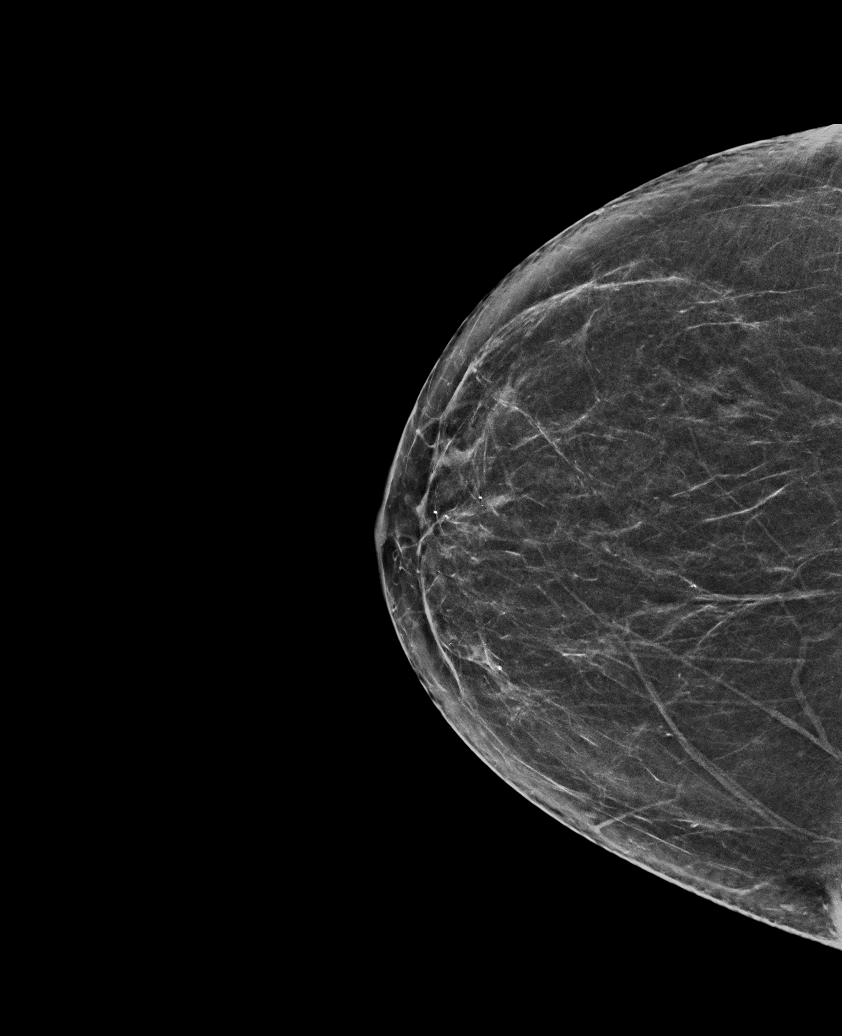

[L MLO synth-2D (1 of 2)]
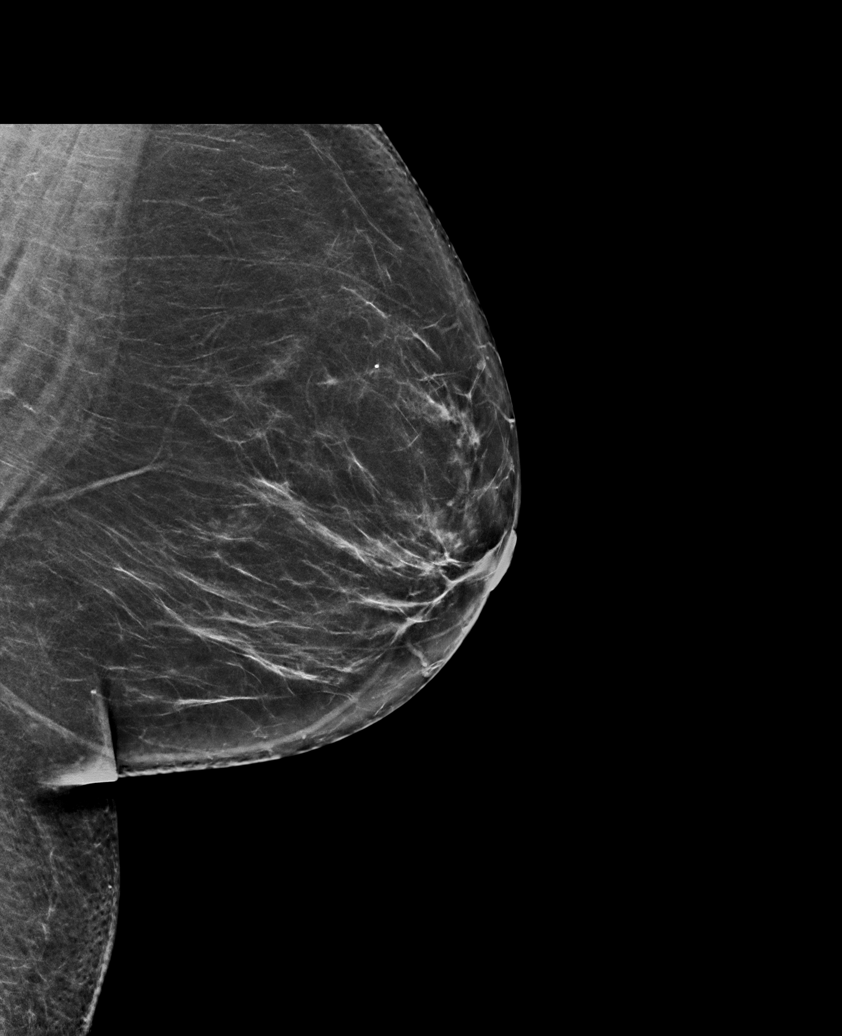

[R MLO synth-2D]
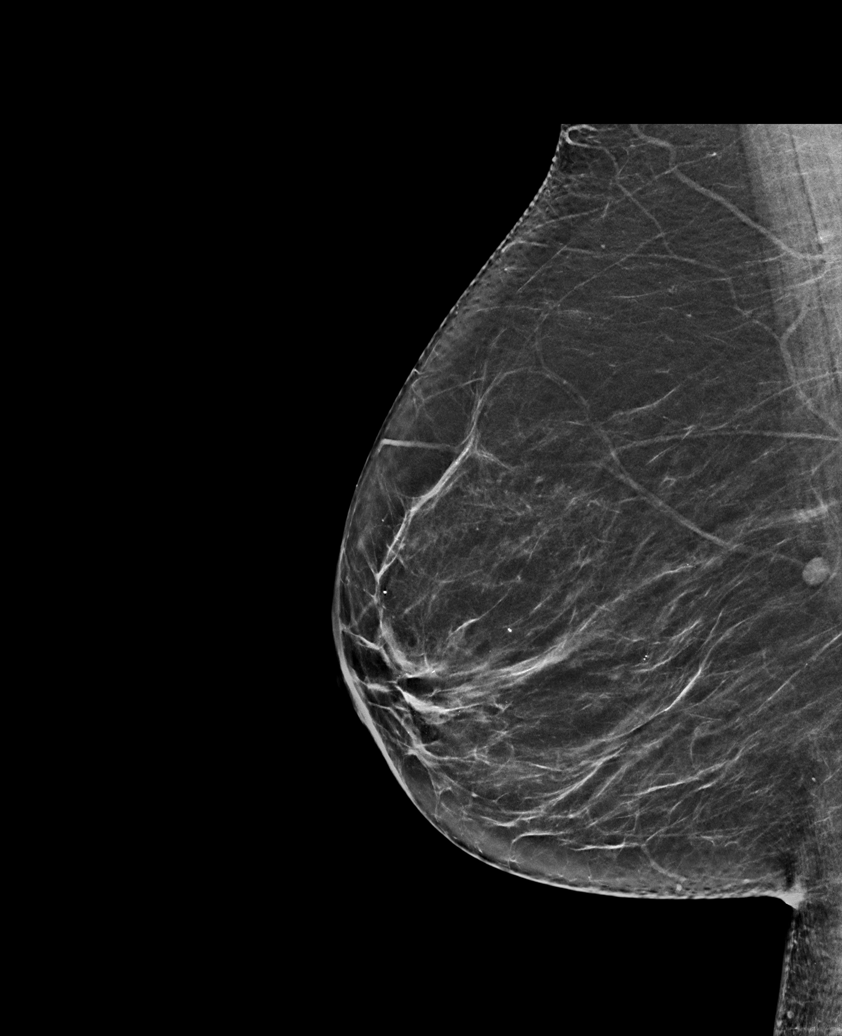

[L MLO synth-2D (2 of 2)]
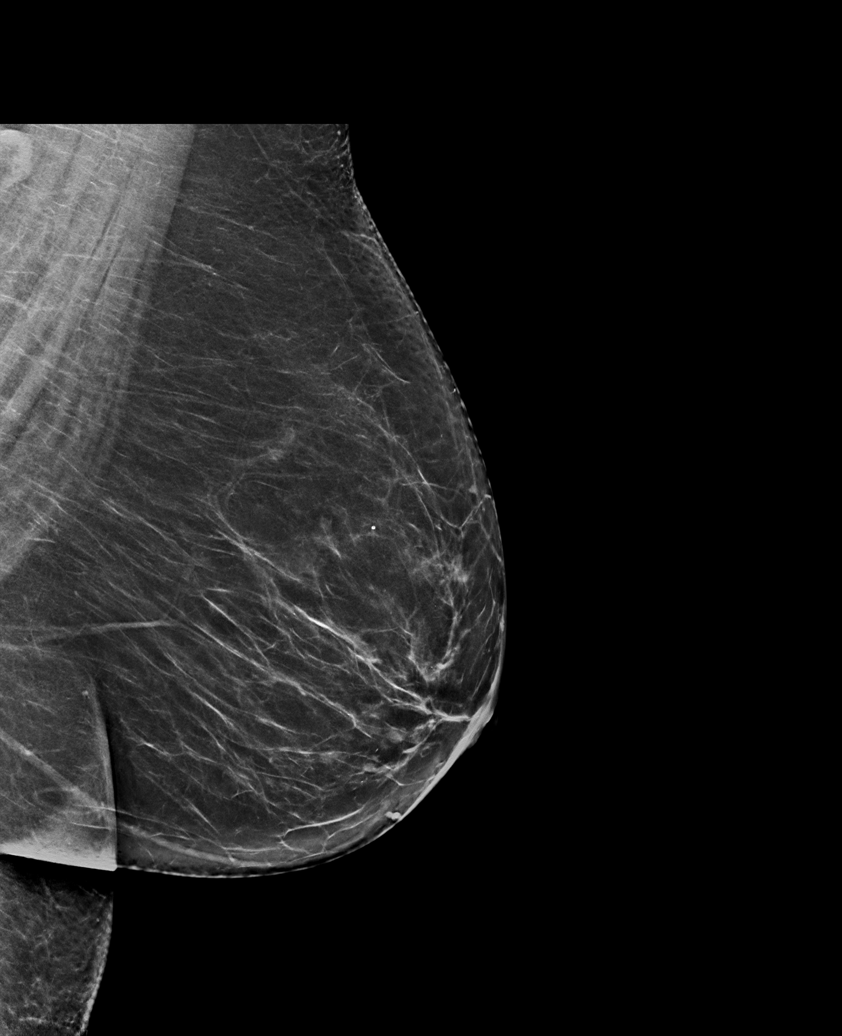

[L MLO tomo · tomo slice 38/75.0]
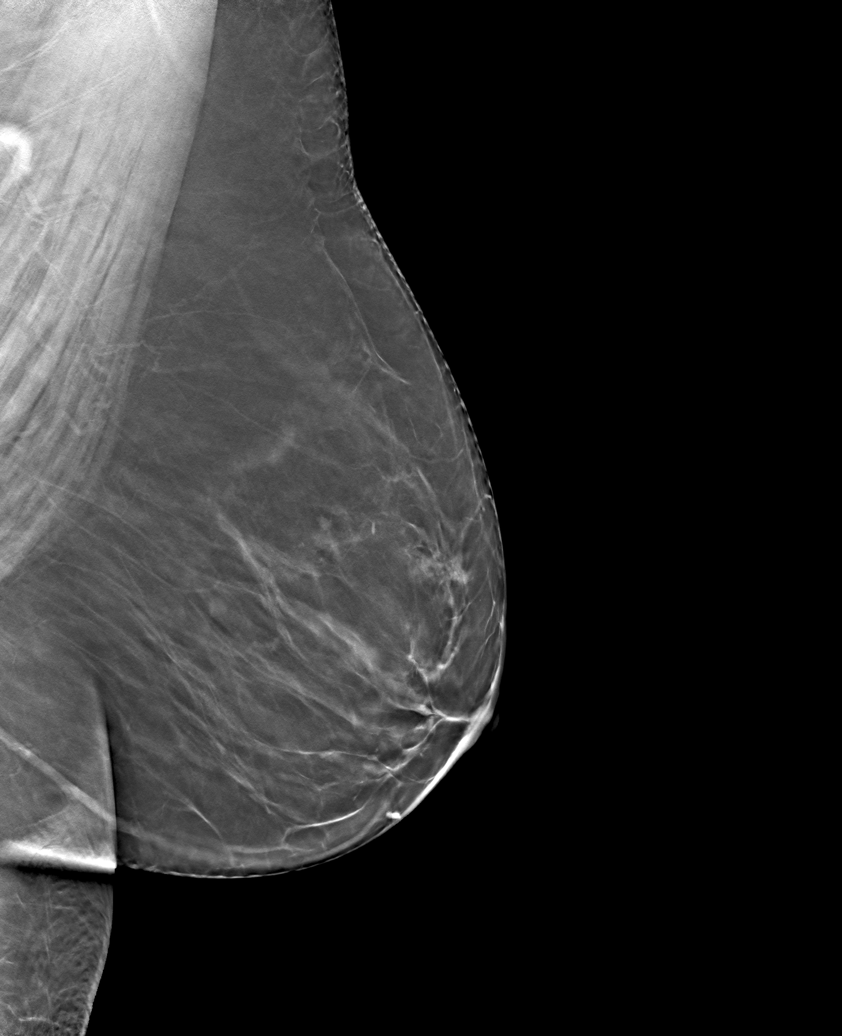

[6 of 30 positions shown; findings below may reference images not displayed]

ACR Breast Density Category b: There are scattered areas of
fibroglandular density.
FINDINGS: There are no findings suspicious for malignancy. Images were
processed with CAD.
IMPRESSION: No mammographic evidence of malignancy. A result letter of this
screening mammogram will be mailed directly to the patient.

RECOMMENDATION:
1.  Screening mammogram in one year. (Code:[id number])

2. Strong family history of breast cancer, including in the
patient's mother and 3 sisters. The [employer]
recommends annual MRI and mammography in patients with an estimated
lifetime risk of developing breast cancer greater than 20 - 25%, or
who are known or suspected to be positive for the breast cancer
gene.

BI-RADS CATEGORY  1: Negative.
